# Patient Record
Sex: Male | Born: 1970 | Race: Black or African American | Hispanic: No | Marital: Married | Smoking: Never smoker
Health system: Southern US, Community
[De-identification: ages and names within clinical notes are randomized; demographics above are authoritative.]

## PROBLEM LIST (undated history)

## (undated) DIAGNOSIS — J42 Unspecified chronic bronchitis: Secondary | ICD-10-CM

## (undated) DIAGNOSIS — I10 Essential (primary) hypertension: Secondary | ICD-10-CM

## (undated) DIAGNOSIS — G459 Transient cerebral ischemic attack, unspecified: Secondary | ICD-10-CM

---

## 2007-03-02 ENCOUNTER — Ambulatory Visit (HOSPITAL_COMMUNITY): Admission: RE | Admit: 2007-03-02 | Discharge: 2007-03-02 | Payer: Self-pay | Admitting: Preventative Medicine

## 2007-03-17 ENCOUNTER — Encounter (HOSPITAL_COMMUNITY): Admission: RE | Admit: 2007-03-17 | Discharge: 2007-04-16 | Payer: Self-pay | Admitting: Preventative Medicine

## 2010-12-08 ENCOUNTER — Encounter: Payer: Self-pay | Admitting: Preventative Medicine

## 2015-01-08 ENCOUNTER — Emergency Department (HOSPITAL_COMMUNITY)
Admission: EM | Admit: 2015-01-08 | Discharge: 2015-01-08 | Disposition: A | Discharge status: Home / Self Care | Payer: 59 | Attending: Emergency Medicine | Admitting: Emergency Medicine

## 2015-01-08 ENCOUNTER — Emergency Department (HOSPITAL_COMMUNITY)
Admission: EM | Admit: 2015-01-08 | Discharge: 2015-01-08 | Disposition: A | Discharge status: Other Acute Inpatient Hospital | Payer: 59 | Source: Home / Self Care | Attending: Emergency Medicine | Admitting: Emergency Medicine

## 2015-01-08 ENCOUNTER — Encounter (HOSPITAL_COMMUNITY): Payer: Self-pay

## 2015-01-08 ENCOUNTER — Emergency Department (HOSPITAL_COMMUNITY): Payer: 59

## 2015-01-08 ENCOUNTER — Encounter (HOSPITAL_COMMUNITY): Payer: Self-pay | Admitting: Emergency Medicine

## 2015-01-08 DIAGNOSIS — Z8709 Personal history of other diseases of the respiratory system: Secondary | ICD-10-CM | POA: Insufficient documentation

## 2015-01-08 DIAGNOSIS — R079 Chest pain, unspecified: Secondary | ICD-10-CM

## 2015-01-08 DIAGNOSIS — I10 Essential (primary) hypertension: Secondary | ICD-10-CM | POA: Diagnosis not present

## 2015-01-08 DIAGNOSIS — R42 Dizziness and giddiness: Secondary | ICD-10-CM | POA: Insufficient documentation

## 2015-01-08 HISTORY — DX: Essential (primary) hypertension: I10

## 2015-01-08 HISTORY — DX: Unspecified chronic bronchitis: J42

## 2015-01-08 LAB — CBC
HCT: 40.3 % (ref 39.0–52.0)
Hemoglobin: 13.8 g/dL (ref 13.0–17.0)
MCH: 26.8 pg (ref 26.0–34.0)
MCHC: 34.2 g/dL (ref 30.0–36.0)
MCV: 78.3 fL (ref 78.0–100.0)
Platelets: 207 10*3/uL (ref 150–400)
RBC: 5.15 MIL/uL (ref 4.22–5.81)
RDW: 13.8 % (ref 11.5–15.5)
WBC: 4.6 10*3/uL (ref 4.0–10.5)

## 2015-01-08 LAB — BASIC METABOLIC PANEL
Anion gap: 9 (ref 5–15)
BUN: 10 mg/dL (ref 6–23)
CALCIUM: 8.7 mg/dL (ref 8.4–10.5)
CHLORIDE: 106 mmol/L (ref 96–112)
CO2: 24 mmol/L (ref 19–32)
CREATININE: 1.4 mg/dL — AB (ref 0.50–1.35)
GFR, EST AFRICAN AMERICAN: 70 mL/min — AB (ref >90)
GFR, EST NON AFRICAN AMERICAN: 61 mL/min — AB (ref >90)
GLUCOSE: 88 mg/dL (ref 70–99)
Potassium: 3.3 mmol/L — ABNORMAL LOW (ref 3.5–5.1)
SODIUM: 139 mmol/L (ref 135–145)

## 2015-01-08 LAB — I-STAT TROPONIN, ED
TROPONIN I, POC: 0.02 ng/mL (ref 0.00–0.08)
Troponin i, poc: 0.01 ng/mL (ref 0.00–0.08)

## 2015-01-08 MED ORDER — SODIUM CHLORIDE 0.9 % IV SOLN
INTRAVENOUS | Status: DC
  Administered 2015-01-08: 14:00:00 via INTRAVENOUS

## 2015-01-08 MED ORDER — ASPIRIN 81 MG PO CHEW
324.0000 mg | CHEWABLE_TABLET | Freq: Once | ORAL | Status: AC
  Administered 2015-01-08: 324 mg via ORAL

## 2015-01-08 MED ORDER — ASPIRIN 81 MG PO CHEW
CHEWABLE_TABLET | ORAL | Status: AC
  Filled 2015-01-08: qty 4

## 2015-01-08 NOTE — ED Notes (Signed)
Chest Xray at the bedside.  

## 2015-01-08 NOTE — ED Provider Notes (Signed)
CSN: [id number]     Arrival date & time 01/08/15  1442 History   First MD Initiated Contact with Patient 01/08/15 1457     Chief Complaint  Patient presents with  . Chest Pain     (Consider location/radiation/quality/duration/timing/severity/associated sxs/prior Treatment) HPI   PCP: No primary care provider on file. Blood pressure 170/95, pulse 60, temperature 98.1 F (36.7 C), temperature source Oral, resp. rate 19, SpO2 100 %.  Bryan Woods is a 44 y.o.male with a significant PMH of hypertension and chronic bronchitis presents to the ER with complaints of chest pain that hs been intermittent over the past week. The pain is substernal and does not radiate. He also endorses having arm weakness and numbness. He also has had some nausea, vomiting, diarrhea, and SOB. He has had symptoms of heart racing, feeling dizzy, lightheaded. He does not feel that rest or exertion changes his pain. He has not seen a cardiologist or had work up for this chest pain in the past. The symptoms usually take place between the night and morning. He reports having significant stressors in his life currently.  This morning he had pain from 5am-7:30am with some anxiety and mild SOB. He had no other associated symptoms. The symptoms resolved after he "settled down". He reports going to the  Urgent Care because his fiance made him and also agreeding to come to the ER because his fiance is making him. He currently has 0/0 pain and no associated symptoms.  Negative Review of Symptoms: fevers, cough, lower extremity swelling, confusion, hx of heart disease.  Past Medical History  Diagnosis Date  . Hypertension   . Chronic bronchitis    History reviewed. No pertinent past surgical history. No family history on file. History  Substance Use Topics  . Smoking status: Never Smoker   . Smokeless tobacco: Never Used  . Alcohol Use: No    Review of Systems  10 Systems reviewed and are negative for acute change  except as noted in the HPI.    Allergies  Review of patient's allergies indicates no known allergies.  Home Medications   Prior to Admission medications   Not on File   BP 158/104 mmHg  Pulse 73  Temp(Src) 98.1 F (36.7 C) (Oral)  Resp 21  SpO2 100% Physical Exam  Constitutional: He appears well-developed and well-nourished. No distress.  HENT:  Head: Normocephalic and atraumatic.  Eyes: Pupils are equal, round, and reactive to light.  Neck: Normal range of motion. Neck supple.  Cardiovascular: Normal rate and regular rhythm.   Pulmonary/Chest: Effort normal and breath sounds normal. He has no decreased breath sounds. He has no rhonchi. He exhibits no tenderness, no bony tenderness and no retraction.  Abdominal: Soft. He exhibits no distension. There is no tenderness. There is no rigidity, no rebound and no guarding.  Musculoskeletal:  No LE swelling.  Neurological: He is alert.  Skin: Skin is warm and dry.  Nursing note and vitals reviewed.   ED Course  Procedures (including critical care time) Labs Review Labs Reviewed  BASIC METABOLIC PANEL - Abnormal; Notable for the following:    Potassium 3.3 (*)    Creatinine, Ser 1.40 (*)    GFR calc non Af Amer 61 (*)    GFR calc Af Amer 70 (*)    All other components within normal limits  CBC  Rosezena SensorI-STAT TROPOININ, ED    Imaging Review Dg Chest Port 1 View  01/08/2015   CLINICAL DATA:  44 year old male with  left side chest pain for 2 days. Initial encounter.  EXAM: PORTABLE CHEST - 1 VIEW  COMPARISON:  None.  FINDINGS: Portable AP view at 1519 hours. Low lung volumes. Allowing for portable technique, the lungs are clear. No pneumothorax or effusion. Normal cardiac size and mediastinal contours. Visualized tracheal air column is within normal limits.  IMPRESSION: Low lung volumes, otherwise no acute cardiopulmonary abnormality.   Electronically Signed   By: Odessa Fleming M.D.   On: 01/08/2015 15:38     EKG Interpretation None       MDM   Final diagnoses:  Chest pain, unspecified chest pain type    Pt has a HEART score of 1  (hypertension) His presentation is atypical for ACS. He has had 1 negative Troponin and has been pain free since 7:30am. I discussed the case with DR. Effie Shy, we will delta Trop him and allow him to follow-up with his PCP where he lives in [place] since he is well established with his PCP.   4:30pm At end of shift, pt left with Tatyana K. PA-C. Pt waiting for delta Troponin which is to be drawn at 5:45 pm.  Filed Vitals:   01/08/15 1600  BP: 158/104  Pulse: 73  Temp:   Resp: [id number], PA-C 01/08/15 1632  Flint Melter, MD 01/08/15 [phone]

## 2015-01-08 NOTE — ED Provider Notes (Signed)
6:39 PM Patient signed out to me at shift change. Patient emergency department complaining of chest pain. Pain has been intermittent over last week. He is complaining of substernal chest pain, sometimes associated with shortness of breath and nausea. He also has had intermittent symptoms of heart racing and feeling dizzy. Also reports some anxiety associated with the symptoms. Patient is low risk for coronary disease. Patient's lab work including 2 troponins are negative. Chest x-ray negative. Plan will be to discharge home, with close outpatient follow-up. Patient is hypertensive, will need recheck. Patient's primary care doctor is out of town. They will call him tomorrow morning. He is instructed to return to emergency department for worsening symptoms. At this time patient is symptom-free, stable for discharge.  Results for orders placed or performed during the hospital encounter of 01/08/15  CBC  Result Value Ref Range   WBC 4.6 4.0 - 10.5 K/uL   RBC 5.15 4.22 - 5.81 MIL/uL   Hemoglobin 13.8 13.0 - 17.0 g/dL   HCT 16.140.3 09.639.0 - 04.552.0 %   MCV 78.3 78.0 - 100.0 fL   MCH 26.8 26.0 - 34.0 pg   MCHC 34.2 30.0 - 36.0 g/dL   RDW 40.913.8 81.111.5 - 91.415.5 %   Platelets 207 150 - 400 K/uL  Basic metabolic panel  Result Value Ref Range   Sodium 139 135 - 145 mmol/L   Potassium 3.3 (L) 3.5 - 5.1 mmol/L   Chloride 106 96 - 112 mmol/L   CO2 24 19 - 32 mmol/L   Glucose, Bld 88 70 - 99 mg/dL   BUN 10 6 - 23 mg/dL   Creatinine, Ser 7.821.40 (H) 0.50 - 1.35 mg/dL   Calcium 8.7 8.4 - 95.610.5 mg/dL   GFR calc non Af Amer 61 (L) >90 mL/min   GFR calc Af Amer 70 (L) >90 mL/min   Anion gap 9 5 - 15  [facility] troponin[facility] ED [facility])  Result Value Ref Range   Troponin i, poc 0.01 0.00 - 0.08 ng/mL   Comment 3          [facility]Stat Troponin[facility] ED [facility])  Result Value Ref Range   Troponin i, poc 0.02 0.00 - 0.08 ng/mL   Comment 3           Dg Chest Port 1 View  01/08/2015   CLINICAL DATA:  44 year old male with left side  chest pain for 2 days. Initial encounter.  EXAM: PORTABLE CHEST - 1 VIEW  COMPARISON:  None.  FINDINGS: Portable AP view at 1519 hours. Low lung volumes. Allowing for portable technique, the lungs are clear. No pneumothorax or effusion. Normal cardiac size and mediastinal contours. Visualized tracheal air column is within normal limits.  IMPRESSION: Low lung volumes, otherwise no acute cardiopulmonary abnormality.   Electronically Signed   By: Odessa FlemingH  Hall M.D.   On: 01/08/2015 15:38      Filed Vitals:   01/08/15 1700 01/08/15 1730 01/08/15 1745 01/08/15 1815  BP: 134/80 138/82 150/97 163/99  Pulse: 60 77 74 65  Temp:      TempSrc:      Resp: 16 17 22 24   SpO2: 99% 100% 100% 100%     Lottie Musselatyana A Lue [place], PA-C 01/08/15 1841  Arby BarretteMarcy Pfeiffer, MD 01/10/15 [phone]

## 2015-01-08 NOTE — ED Notes (Signed)
Tiffany, PA at the bedside. 

## 2015-01-08 NOTE — ED Notes (Signed)
[facility] here to transport 

## 2015-01-08 NOTE — ED Notes (Signed)
Phlebotomy at the bedside  

## 2015-01-08 NOTE — Discharge Instructions (Signed)
We have determined that your problem requires further evaluation in the emergency department.  We will take care of your transport there.  Once at the emergency department, you will be evaluated by a provider and they will order whatever treatment or tests they deem necessary.  We cannot guarantee that they will do any specific test or do any specific treatment.  ° °

## 2015-01-08 NOTE — ED Provider Notes (Signed)
Chief Complaint   Chest Pain   History of Present Illness    Bryan Woods is a 44 year old male has had a one-week history of intermittent substernal chest pressure without radiation rated 2-3/10 in intensity lasting for hours. Worse with stress and better if he relaxes. Unrelated to exertion. This has been associated with bilateral arm weakness and numbness in the right arm, nausea, vomiting, and shortness of breath. He's also felt his heart racing and felt dizzy and lightheaded. He has no history of heart disease, diabetes, elevated cholesterol, or cigarette smoking. He does have high blood pressure. He denies history of fever, chills, URI symptoms, coughing, wheezing, sputum production, or hemoptysis. Pain has not been pleuritic and he denies any leg pain or swelling. He's had no syncope. He has had some abdominal pain. He has no history of chronic kidney disease, lupus, cocaine use, or HIV.  Review of Systems    Other than noted above, the patient denies any of the following symptoms. Systemic:  No fever or chills. Pulmonary:  No cough, wheezing, shortness of breath, sputum production, hemoptysis. Cardiac:  No palpitations, rapid heartbeat, dizziness, presyncope or syncope. GI:  No abdominal pain, heartburn, nausea, or vomiting. Ext:  No leg pain or swelling.  PMFSH    Past medical history, family history, social history, meds, and allergies were reviewed.   Physical Exam     Vital signs:  BP 163/93 mmHg  Pulse 65  Temp(Src) 98.1 F (36.7 C) (Oral)  Resp 18  SpO2 99% Gen:  Alert, oriented, in no distress, skin warm and dry. ENT:  Mucous membranes moist, pharynx clear. Neck:  Supple, no adenopathy or tenderness.  No JVD. Lungs:  Clear to auscultation, no wheezes, rales or rhonchi.  No respiratory distress. Heart:  Regular rhythm.  No gallops, murmers, clicks or rubs. Chest:  No chest wall tenderness. Abdomen:  Soft, nontender, no organomegaly or mass.  Bowel sounds  normal.  No pulsatile abdominal mass or bruit. Ext:  No edema.  No calf tenderness and Homann's sign negative.  Pulses full and equal. Skin:  Warm and dry.  No rash.  EKG Results:  Date: 01/08/2015  Rate: 65  Rhythm: normal sinus rhythm  QRS Axis: normal--86  Intervals: normal--QTc interval 411 ms  ST/T Wave abnormalities: normal  Conduction Disutrbances:none  Narrative Interpretation: normal sinus rhythm, possible left atrial enlargement, borderline EKG  Old EKG Reviewed: none available    Course in Urgent Care Center   The following medications were given:  Medications  aspirin chewable tablet 324 mg   0.9 %  sodium chloride infusion                                                                                                                                      Assessment     The encounter diagnosis was Chest pain, unspecified chest pain type.  Differential diagnosis is acute coronary  syndrome, pulmonary embolism, ruptured aneurysm, pneumothorax, Boerhaave syndrome, pericarditis, musculoskeletal pain, or reflux esophagitis.   Plan     The patient was transferred to the ED via [facility] in stable condition.  Medical Decision Making:  44 year old male has had a one-week history of intermittent substernal chest pressure without radiation rated 2-3/10 in intensity lasting for hours. Worse with stress and better if he relaxes. Unrelated to exertion. This has been associated with bilateral arm weakness and numbness in the right arm, nausea, vomiting, and shortness of breath. He's also felt his heart racing and felt dizzy and lightheaded. He has no history of heart disease, diabetes, elevated cholesterol, or cigarette smoking. He does have high blood pressure. Examination was within normal limits his EKG was normal. We will start IV normal saline and give him aspirin. He's not having any pain right now, so no need for nitroglycerin. He needs to be ruled out for acute coronary syndrome.         Reuben Likesavid C Farron Watrous, MD 01/08/15 [phone]

## 2015-01-08 NOTE — ED Notes (Signed)
Per EMS, [facility] that started this morning at 0600. Patient reports dizziness, bilateral arm weakness, and an episode of nausea earlier, but denies any radiation or other symptoms. Patient has history of Hypertension. Given 324 mg of Asprin at Urgent Care. Patient's original pain was 5/10 and decreased to 1/10. Vitals per EMS: 164/90, 70 Regular HR, 16 RR, 99% on RA.

## 2015-01-08 NOTE — ED Notes (Signed)
Pt woke up this morning with chest pressure, headache, SOB, dizziness and some numbness and tingling in the right arm/elbow. Pt states he had a similar episode last week.

## 2015-01-08 NOTE — ED Notes (Signed)
PA at the bedside.

## 2015-01-08 NOTE — Discharge Instructions (Signed)
Take 81mg  aspirin daily. Follow up with your doctor as soon as able. Return if worsening symptoms.    Chest Pain (Nonspecific) It is often hard to give a specific diagnosis for the cause of chest pain. There is always a chance that your pain could be related to something serious, such as a heart attack or a blood clot in the lungs. You need to follow up with your health care provider for further evaluation. CAUSES   Heartburn.  Pneumonia or bronchitis.  Anxiety or stress.  Inflammation around your heart (pericarditis) or lung (pleuritis or pleurisy).  A blood clot in the lung.  A collapsed lung (pneumothorax). It can develop suddenly on its own (spontaneous pneumothorax) or from trauma to the chest.  Shingles infection (herpes zoster virus). The chest wall is composed of bones, muscles, and cartilage. Any of these can be the source of the pain.  The bones can be bruised by injury.  The muscles or cartilage can be strained by coughing or overwork.  The cartilage can be affected by inflammation and become sore (costochondritis). DIAGNOSIS  Lab tests or other studies may be needed to find the cause of your pain. Your health care provider may have you take a test called an ambulatory electrocardiogram (ECG). An ECG records your heartbeat patterns over a 24-hour period. You may also have other tests, such as:  Transthoracic echocardiogram (TTE). During echocardiography, sound waves are used to evaluate how blood flows through your heart.  Transesophageal echocardiogram (TEE).  Cardiac monitoring. This allows your health care provider to monitor your heart rate and rhythm in real time.  Holter monitor. This is a portable device that records your heartbeat and can help diagnose heart arrhythmias. It allows your health care provider to track your heart activity for several days, if needed.  Stress tests by exercise or by giving medicine that makes the heart beat faster. TREATMENT    Treatment depends on what may be causing your chest pain. Treatment may include:  Acid blockers for heartburn.  Anti-inflammatory medicine.  Pain medicine for inflammatory conditions.  Antibiotics if an infection is present.  You may be advised to change lifestyle habits. This includes stopping smoking and avoiding alcohol, caffeine, and chocolate.  You may be advised to keep your head raised (elevated) when sleeping. This reduces the chance of acid going backward from your stomach into your esophagus. Most of the time, nonspecific chest pain will improve within 2-3 days with rest and mild pain medicine.  HOME CARE INSTRUCTIONS   If antibiotics were prescribed, take them as directed. Finish them even if you start to feel better.  For the next few days, avoid physical activities that bring on chest pain. Continue physical activities as directed.  Do not use any tobacco products, including cigarettes, chewing tobacco, or electronic cigarettes.  Avoid drinking alcohol.  Only take medicine as directed by your health care provider.  Follow your health care provider's suggestions for further testing if your chest pain does not go away.  Keep any follow-up appointments you made. If you do not go to an appointment, you could develop lasting (chronic) problems with pain. If there is any problem keeping an appointment, call to reschedule. SEEK MEDICAL CARE IF:   Your chest pain does not go away, even after treatment.  You have a rash with blisters on your chest.  You have a fever. SEEK IMMEDIATE MEDICAL CARE IF:   You have increased chest pain or pain that spreads to your arm,  neck, jaw, back, or abdomen.  You have shortness of breath.  You have an increasing cough, or you cough up blood.  You have severe back or abdominal pain.  You feel nauseous or vomit.  You have severe weakness.  You faint.  You have chills. This is an emergency. Do not wait to see if the pain will  go away. Get medical help at once. Call your local emergency services (911 in [place] Do not drive yourself to the hospital. MAKE SURE YOU:   Understand these instructions.  Will watch your condition.  Will get help right away if you are not doing well or get worse. Document Released: 08/13/2005 Document Revised: 11/08/2013 Document Reviewed: 06/08/2008 [facility] Patient Information 2015 [place]. This information is not intended to replace advice given to you by your health care provider. Make sure you discuss any questions you have with your health care provider.

## 2019-03-15 ENCOUNTER — Other Ambulatory Visit: Payer: Self-pay

## 2019-03-15 ENCOUNTER — Ambulatory Visit (INDEPENDENT_AMBULATORY_CARE_PROVIDER_SITE_OTHER): Payer: Self-pay | Admitting: Family Medicine

## 2019-03-15 DIAGNOSIS — I10 Essential (primary) hypertension: Secondary | ICD-10-CM

## 2019-03-15 MED ORDER — LOSARTAN POTASSIUM 50 MG PO TABS: 50.0000 mg | ORAL_TABLET | Freq: Every day | ORAL | 3 refills | Status: DC

## 2019-03-15 NOTE — Progress Notes (Signed)
Patient ID: Bryan Woods, male   DOB: 12-Jun-1971, 48 y.o.   MRN: [id number]  This visit type was conducted due to national recommendations for restrictions regarding the COVID-19 pandemic in an effort to limit this patient's exposure and mitigate transmission in our community.   Virtual Visit via Video Note  I connected with Flossie Dibble on 03/15/19 at  1:15 PM EDT by a video enabled telemedicine application and verified that I am speaking with the correct person using two identifiers.  Location patient: home Location provider:work or home office Persons participating in the virtual visit: patient, provider  I discussed the limitations of evaluation and management by telemedicine and the availability of in person appointments. The patient expressed understanding and agreed to proceed.   HPI: Patient is establishing care.  He has history of hypertension.  He states about 10 years ago he had somewhat equivocal but possible stroke-versus complicated migraine.  He has been on blood pressure medication since then.  He was on lisinopril for several years but developed some cough.  Now takes losartan 50 mg daily.  He states his blood pressures been fairly well controlled.  Denies any recent headaches or dizziness.  No chest pains.  He currently lives in [place] but is looking at moving to [place] soon and he works in [place].  Never smoked.  No regular alcohol use.  Generally feels well at this time.  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  Past Medical History:  Diagnosis Date  . Chronic bronchitis   . Hypertension     No past surgical history on file.  No family history on file.  SOCIAL HX: Non-smoker.  No regular alcohol use   Current Outpatient Medications:  .  losartan (COZAAR) 50 MG tablet, Take 1 tablet (50 mg total) by mouth daily., Disp:  90 tablet, Rfl: 3  EXAM:  VITALS per patient if applicable:  GENERAL: alert, oriented, appears well and in no acute distress  HEENT: atraumatic, conjunttiva clear, no obvious abnormalities on inspection of external nose and ears  NECK: normal movements of the head and neck  LUNGS: on inspection no signs of respiratory distress, breathing rate appears normal, no obvious gross SOB, gasping or wheezing  CV: no obvious cyanosis  MS: moves all visible extremities without noticeable abnormality  PSYCH/NEURO: pleasant and cooperative, no obvious depression or anxiety, speech and thought processing grossly intact  ASSESSMENT AND PLAN:  Discussed the following assessment and plan:  Essential hypertension  -Refill losartan 50 mg once daily -Patient will call back later to establish complete physical and in office evaluation     I discussed the assessment and treatment plan with the patient. The patient was provided an opportunity to ask questions and all were answered. The patient agreed with the plan and demonstrated an understanding of the instructions.   The patient was advised to call back or seek an in-person evaluation if the symptoms worsen or if the condition fails to improve as anticipated.   Evelena Peat, MD

## 2019-03-29 ENCOUNTER — Encounter (HOSPITAL_COMMUNITY): Payer: Self-pay | Admitting: Obstetrics and Gynecology

## 2019-03-29 ENCOUNTER — Emergency Department (HOSPITAL_COMMUNITY): Payer: Medicaid - Out of State

## 2019-03-29 ENCOUNTER — Ambulatory Visit: Payer: Self-pay

## 2019-03-29 ENCOUNTER — Observation Stay (HOSPITAL_COMMUNITY)
Admission: EM | Admit: 2019-03-29 | Discharge: 2019-03-30 | Disposition: A | Discharge status: Home / Self Care | Payer: Medicaid - Out of State | Attending: Internal Medicine | Admitting: Internal Medicine

## 2019-03-29 ENCOUNTER — Other Ambulatory Visit: Payer: Self-pay

## 2019-03-29 DIAGNOSIS — Z1159 Encounter for screening for other viral diseases: Secondary | ICD-10-CM | POA: Insufficient documentation

## 2019-03-29 DIAGNOSIS — I10 Essential (primary) hypertension: Secondary | ICD-10-CM | POA: Diagnosis not present

## 2019-03-29 DIAGNOSIS — I160 Hypertensive urgency: Secondary | ICD-10-CM | POA: Diagnosis not present

## 2019-03-29 DIAGNOSIS — J181 Lobar pneumonia, unspecified organism: Secondary | ICD-10-CM | POA: Insufficient documentation

## 2019-03-29 DIAGNOSIS — Z79899 Other long term (current) drug therapy: Secondary | ICD-10-CM | POA: Insufficient documentation

## 2019-03-29 DIAGNOSIS — Z8673 Personal history of transient ischemic attack (TIA), and cerebral infarction without residual deficits: Secondary | ICD-10-CM | POA: Insufficient documentation

## 2019-03-29 DIAGNOSIS — R9431 Abnormal electrocardiogram [ECG] [EKG]: Secondary | ICD-10-CM | POA: Diagnosis not present

## 2019-03-29 DIAGNOSIS — Z7901 Long term (current) use of anticoagulants: Secondary | ICD-10-CM | POA: Insufficient documentation

## 2019-03-29 DIAGNOSIS — I351 Nonrheumatic aortic (valve) insufficiency: Secondary | ICD-10-CM | POA: Diagnosis not present

## 2019-03-29 DIAGNOSIS — J42 Unspecified chronic bronchitis: Secondary | ICD-10-CM | POA: Diagnosis not present

## 2019-03-29 DIAGNOSIS — I2699 Other pulmonary embolism without acute cor pulmonale: Principal | ICD-10-CM | POA: Insufficient documentation

## 2019-03-29 DIAGNOSIS — J189 Pneumonia, unspecified organism: Secondary | ICD-10-CM | POA: Diagnosis not present

## 2019-03-29 HISTORY — DX: Transient cerebral ischemic attack, unspecified: G45.9

## 2019-03-29 LAB — CBC
HCT: 42.5 % (ref 39.0–52.0)
Hemoglobin: 13.8 g/dL (ref 13.0–17.0)
MCH: 26.2 pg (ref 26.0–34.0)
MCHC: 32.5 g/dL (ref 30.0–36.0)
MCV: 80.6 fL (ref 80.0–100.0)
Platelets: 263 10*3/uL (ref 150–400)
RBC: 5.27 MIL/uL (ref 4.22–5.81)
RDW: 13.5 % (ref 11.5–15.5)
WBC: 6.8 10*3/uL (ref 4.0–10.5)
nRBC: 0 % (ref 0.0–0.2)

## 2019-03-29 LAB — COMPREHENSIVE METABOLIC PANEL
ALT: 33 U/L (ref 0–44)
AST: 24 U/L (ref 15–41)
Albumin: 4.5 g/dL (ref 3.5–5.0)
Alkaline Phosphatase: 66 U/L (ref 38–126)
Anion gap: 8 (ref 5–15)
BUN: 19 mg/dL (ref 6–20)
CO2: 24 mmol/L (ref 22–32)
Calcium: 9.4 mg/dL (ref 8.9–10.3)
Chloride: 109 mmol/L (ref 98–111)
Creatinine, Ser: 1.53 mg/dL — ABNORMAL HIGH (ref 0.61–1.24)
GFR calc Af Amer: >60 mL/min (ref >60)
GFR calc non Af Amer: 54 mL/min — ABNORMAL LOW (ref >60)
Glucose, Bld: 81 mg/dL (ref 70–99)
Potassium: 4 mmol/L (ref 3.5–5.1)
Sodium: 141 mmol/L (ref 135–145)
Total Bilirubin: 0.4 mg/dL (ref 0.3–1.2)
Total Protein: 7.6 g/dL (ref 6.5–8.1)

## 2019-03-29 LAB — TROPONIN I: Troponin I: <0.03 ng/mL (ref <0.03)

## 2019-03-29 LAB — D-DIMER, QUANTITATIVE: D-Dimer, Quant: 2.63 ug/mL-FEU — ABNORMAL HIGH (ref 0.00–0.50)

## 2019-03-29 LAB — BRAIN NATRIURETIC PEPTIDE: B Natriuretic Peptide: 22.7 pg/mL (ref 0.0–100.0)

## 2019-03-29 LAB — SARS CORONAVIRUS 2 BY RT PCR (HOSPITAL ORDER, PERFORMED IN ~~LOC~~ HOSPITAL LAB): SARS Coronavirus 2: NEGATIVE

## 2019-03-29 LAB — LIPASE, BLOOD: Lipase: 37 U/L (ref 11–51)

## 2019-03-29 LAB — HEPARIN LEVEL (UNFRACTIONATED): Heparin Unfractionated: 0.22 IU/mL — ABNORMAL LOW (ref 0.30–0.70)

## 2019-03-29 MED ORDER — LOSARTAN POTASSIUM 50 MG PO TABS
50.0000 mg | ORAL_TABLET | Freq: Once | ORAL | Status: AC
  Administered 2019-03-29: 50 mg via ORAL
  Filled 2019-03-29: qty 1

## 2019-03-29 MED ORDER — LOSARTAN POTASSIUM 50 MG PO TABS
50.0000 mg | ORAL_TABLET | Freq: Every day | ORAL | Status: DC
  Administered 2019-03-30: 50 mg via ORAL
  Filled 2019-03-29 (×2): qty 1

## 2019-03-29 MED ORDER — METOPROLOL TARTRATE 50 MG PO TABS
50.0000 mg | ORAL_TABLET | Freq: Two times a day (BID) | ORAL | Status: DC
  Administered 2019-03-30: 50 mg via ORAL
  Filled 2019-03-29 (×2): qty 1

## 2019-03-29 MED ORDER — HEPARIN BOLUS VIA INFUSION
3000.0000 [IU] | Freq: Once | INTRAVENOUS | Status: AC
  Administered 2019-03-29: 17:00:00 3000 [IU] via INTRAVENOUS
  Filled 2019-03-29: qty 3000

## 2019-03-29 MED ORDER — HYDRALAZINE HCL 20 MG/ML IJ SOLN
10.0000 mg | Freq: Four times a day (QID) | INTRAMUSCULAR | Status: DC | PRN
  Administered 2019-03-30: 10 mg via INTRAVENOUS
  Filled 2019-03-29: qty 1

## 2019-03-29 MED ORDER — SODIUM CHLORIDE (PF) 0.9 % IJ SOLN
INTRAMUSCULAR | Status: AC
  Filled 2019-03-29: qty 50

## 2019-03-29 MED ORDER — SODIUM CHLORIDE 0.9 % IV SOLN
1.0000 g | INTRAVENOUS | Status: DC
  Filled 2019-03-29: qty 10

## 2019-03-29 MED ORDER — IOHEXOL 350 MG/ML SOLN
100.0000 mL | Freq: Once | INTRAVENOUS | Status: AC | PRN
  Administered 2019-03-29: 100 mL via INTRAVENOUS

## 2019-03-29 MED ORDER — HEPARIN (PORCINE) 25000 UT/250ML-% IV SOLN
2000.0000 [IU]/h | INTRAVENOUS | Status: DC
  Administered 2019-03-29 – 2019-03-30 (×2): 2000 [IU]/h via INTRAVENOUS
  Filled 2019-03-29: qty 250

## 2019-03-29 MED ORDER — HEPARIN (PORCINE) 25000 UT/250ML-% IV SOLN
1700.0000 [IU]/h | INTRAVENOUS | Status: DC
  Administered 2019-03-29: 1700 [IU]/h via INTRAVENOUS
  Filled 2019-03-29: qty 250

## 2019-03-29 MED ORDER — HEPARIN BOLUS VIA INFUSION
2000.0000 [IU] | Freq: Once | INTRAVENOUS | Status: AC
  Administered 2019-03-29: 2000 [IU] via INTRAVENOUS
  Filled 2019-03-29: qty 2000

## 2019-03-29 MED ORDER — SODIUM CHLORIDE 0.9% FLUSH
3.0000 mL | Freq: Once | INTRAVENOUS | Status: AC
  Administered 2019-03-29: 3 mL via INTRAVENOUS

## 2019-03-29 NOTE — Progress Notes (Signed)
ANTICOAGULATION CONSULT NOTE - Initial Consult  Pharmacy Consult for IV heparin Indication: pulmonary embolus  No Known Allergies  Patient Measurements: Height: 5\' 11"  (180.3 cm) Weight: 240 lb (108.9 kg) IBW/kg (Calculated) : 75.3 Heparin Dosing Weight: 98.6  Vital Signs: Temp: 97.7 F (36.5 C) (05/12 1410) Temp Source: Oral (05/12 1410) BP: 180/113 (05/12 1429) Pulse Rate: 79 (05/12 1430)  Labs: Recent Labs    03/29/19 1417 03/29/19 1418  HGB  --  13.8  HCT  --  42.5  PLT  --  263  CREATININE 1.53*  --   TROPONINI  --  <0.03    Estimated Creatinine Clearance: 75.7 mL/min (A) (by C-G formula based on SCr of 1.53 mg/dL (H)).   Medical History: Past Medical History:  Diagnosis Date  . Chronic bronchitis (HCC)   . Hypertension   . TIA (transient ischemic attack)     Medications:  Scheduled:  . sodium chloride (PF)       Infusions:    Assessment: 48 yo male presented to ED with SOB and CP x 2 weeks found to have PE. To start IV heparin per pharmacy dosing. Baseline labs already drawn. Note not on any anticoagulant prior to admission  Goal of Therapy:  Heparin level 0.3-0.7 units/ml Monitor platelets by anticoagulation protocol: Yes   Plan:  1) 3000 unit IV heparin bolus then 2) 1700 units/hr IV heparin infusion rate 3) Check heparin level 6 hours after start of IV heparin 4) Daily CBC and heparin level  Hessie Knows, PharmD, BCPS 03/29/2019 4:25 PM

## 2019-03-29 NOTE — Telephone Encounter (Signed)
Please see message. °

## 2019-03-29 NOTE — Progress Notes (Signed)
ANTICOAGULATION CONSULT NOTE -  Consult  Pharmacy Consult for IV heparin Indication: pulmonary embolus  No Known Allergies  Patient Measurements: Height: 5\' 10"  (177.8 cm) Weight: 240 lb (108.9 kg) IBW/kg (Calculated) : 73 Heparin Dosing Weight: 98.6  Vital Signs: Temp: 97.8 F (36.6 C) (05/12 2110) Temp Source: Oral (05/12 2110) BP: 186/120 (05/12 2110) Pulse Rate: 76 (05/12 2110)  Labs: Recent Labs    03/29/19 1417 03/29/19 1418 03/29/19 2256  HGB  --  13.8  --   HCT  --  42.5  --   PLT  --  263  --   HEPARINUNFRC  --   --  0.22*  CREATININE 1.53*  --   --   TROPONINI  --  <0.03  --     Estimated Creatinine Clearance: 74.6 mL/min (A) (by C-G formula based on SCr of 1.53 mg/dL (H)).   Medical History: Past Medical History:  Diagnosis Date  . Chronic bronchitis (HCC)   . Hypertension   . TIA (transient ischemic attack)     Medications:  Scheduled:  . losartan  50 mg Oral Daily  . metoprolol tartrate  50 mg Oral BID  . sodium chloride (PF)       Infusions:  . cefTRIAXone (ROCEPHIN)  IV    . heparin 1,700 Units/hr (03/29/19 1640)    Assessment: 47 yo male presented to ED with SOB and CP x 2 weeks found to have PE. To start IV heparin per pharmacy dosing. Baseline labs already drawn. Note not on any anticoagulant prior to admission Today, 5/12 2318 HL = 0.22 below goal, no bleeding or infusion issues per RN.   Goal of Therapy:  Heparin level 0.3-0.7 units/ml Monitor platelets by anticoagulation protocol: Yes   Plan:  1) Give 2000 unit rebolus x1 now 2) Increase heparin drip to 2000 units/hr 3) Check heparin level 6 hours after rate increase 4) Daily CBC and heparin level  Lorenza Evangelist 03/29/2019 11:25 PM

## 2019-03-29 NOTE — Telephone Encounter (Signed)
Pt. Called to report upper mid abdominal pain for 2-2.5 weeks.  Stated the pain started gradually, and has worsened.  Has radiation of pain down into lower abdomen. C/o abdominal bloating, with freq. Burping and passing a lot of gas.  Rated pain at 8-9/10, and constant.  Stated has had normal stools daily, over past week, with exception of diarrhea x 1.  Reported he is short of breath with both rest and activity.  C/o intermittent nausea; no vomiting.  Denied fever/ chills.  Stated he had one episode of upper right, anterior chest pain within past week; unsure of how long it lasted.  Denied nausea or sweating with the chest pain.  Did report shortness of breath at that time.  Stated he rested and calmed himself, and the chest pain went away.  Advised due to worsening and severity of the abdominal pain, and of shortness of breath, he needs to be evaluated in the ER.  Care advice given per protocol.  Pt. Verb. Understanding.  Agreed with plan.   Reason for Disposition . [1] SEVERE pain (e.g., excruciating) AND [2] present > 1 hour  Answer Assessment - Initial Assessment Questions 1. LOCATION: "Where does it hurt?"      In upper mid abdomen, below the breast bone  2. RADIATION: "Does the pain shoot anywhere else?" (e.g., chest, back)     Some radiation downward into lower abdomen 3. ONSET: "When did the pain begin?" (Minutes, hours or days ago)      For a couple weeks 4. SUDDEN: "Gradual or sudden onset?"     Gradual onset  5. PATTERN "Does the pain come and go, or is it constant?"    - If constant: "Is it getting better, staying the same, or worsening?"      (Note: Constant means the pain never goes away completely; most serious pain is constant and it progresses)     - If intermittent: "How long does it last?" "Do you have pain now?"     (Note: Intermittent means the pain goes away completely between bouts) Constant pain  6. SEVERITY: "How bad is the pain?"  (e.g., Scale 1-10; mild, moderate, or  severe)    - MILD (1-3): doesn't interfere with normal activities, abdomen soft and not tender to touch     - MODERATE (4-7): interferes with normal activities or awakens from sleep, tender to touch     - SEVERE (8-10): excruciating pain, doubled over, unable to do any normal activities      8-9/10 7. RECURRENT SYMPTOM: "Have you ever had this type of abdominal pain before?" If so, ask: "When was the last time?" and "What happened that time?"      Several years ago; was placed on Aciphex at that time.  8. CAUSE: "What do you think is causing the abdominal pain?"     Unknown  9. RELIEVING/AGGRAVATING FACTORS: "What makes it better or worse?" (e.g., movement, antacids, bowel movement)     If upper abdomen gets bumped, it is painful; took Omeprazole for pain in upper abdomen; no relief 10. OTHER SYMPTOMS: "Has there been any vomiting, diarrhea, constipation, or urine problems?"      C/o abdominal bloating ; burping and passing a lot of gas ; reported having regular stools, alternating with constipation; has gone daily over past week; reported diarrhea x1 two days ago; c/o intermittent nausea ; no vomiting; no fever/chills; has shortness of breath with rest and activity over past 2-2.5 weeks.  Reported episode of  chest pain x 1, in last week on right anterior chest; reported he calmed himself, and it improved.  Protocols used: ABDOMINAL PAIN - MALE-A-AH

## 2019-03-29 NOTE — H&P (Signed)
History and Physical    Valeriy Talburt WTU:[id number] DOB: Apr 12, 1971 DOA: 03/29/2019  PCP: Kristian Covey, MD Patient coming from: Home  Chief Complaint: Shortness of breath HPI: Bryan Woods is a 48 y.o. male with medical history significant of hypertension came in with complaints of shortness of breath and chest pressure which increases with breathing for the past few hours.  He has no history of blood clots nor he has family history of blood clots.  He reports during quarantine he stayed home till they were called to work.  He denies any lower extremity swelling or pain he has chronic bilateral knee swelling and pain from playing basketball with his son.  He denies fever or chills he denies sick contacts or travel he denies cough COVID test was negative.  He denies genitourinary complaints he feels after he eats his belly bloats up and he has a lot of gas.  He has no constipation or diarrhea nausea or vomiting.  He denies headache changes with his vision localized weakness generalized weakness.  Denies smoking or alcohol use or illegal drug use  ED Course: Patient was started on heparin vital signs were 208/140, pulse is 84 pulse ox 93% on room air respirations 16 he is afebrile CT angiogram of the chest show several lower lobe pulmonary embolism bilaterally no evidence of right heart strain no thoracic aneurysm or dissection focal area of pneumonia in the right middle lobe superiorly and medially otherwise lungs are clear.  Plain chest x-ray official reading is lungs are clear.  Review of Systems: As per HPI otherwise all other systems reviewed and are negative  Ambulatory Status: Ambulatory at baseline  Past Medical History:  Diagnosis Date   Chronic bronchitis (HCC)    Hypertension    TIA (transient ischemic attack)     History reviewed. No pertinent surgical history.  Social History   Socioeconomic History   Marital status: Married    Spouse name: Not on file    Number of children: Not on file   Years of education: Not on file   Highest education level: Not on file  Occupational History   Not on file  Social Needs   Financial resource strain: Not on file   Food insecurity:    Worry: Not on file    Inability: Not on file   Transportation needs:    Medical: Not on file    Non-medical: Not on file  Tobacco Use   Smoking status: Never Smoker   Smokeless tobacco: Never Used  Substance and Sexual Activity   Alcohol use: No   Drug use: No   Sexual activity: Yes  Lifestyle   Physical activity:    Days per week: Not on file    Minutes per session: Not on file   Stress: Not on file  Relationships   Social connections:    Talks on phone: Not on file    Gets together: Not on file    Attends religious service: Not on file    Active member of club or organization: Not on file    Attends meetings of clubs or organizations: Not on file    Relationship status: Not on file   Intimate partner violence:    Fear of current or ex partner: Not on file    Emotionally abused: Not on file    Physically abused: Not on file    Forced sexual activity: Not on file  Other Topics Concern   Not on file  Social History  Narrative   Not on file    No Known Allergies  No family history on file.    Prior to Admission medications   Medication Sig Start Date End Date Taking? Authorizing Provider  losartan (COZAAR) 50 MG tablet Take 1 tablet (50 mg total) by mouth daily. 03/15/19   Burchette, Elberta Fortis, MD    Physical Exam: Patient resting in bed complaining of shortness of breath Vitals:   03/29/19 1410 03/29/19 1429 03/29/19 1430 03/29/19 1608  BP: (!) 190/120 (!) 180/113  (!) 208/140  Pulse: 99 79 79 84  Resp: 18 18 (!) 22 16  Temp: 97.7 F (36.5 C)     TempSrc: Oral     SpO2: 100% 95% 98% 93%  Weight:      Height:          General:  Appears calm and comfortable  Eyes:  PERRL, EOMI, normal lids, iris  ENT:  grossly normal  hearing, lips & tongue, mmm  Neck:  no LAD, masses or thyromegaly  Cardiovascular:  RRR, no m/r/g. No LE edema.   Respiratory: Scattered rhonchi in both lung fields bilaterally, no w/r/r. Normal respiratory effort.  Abdomen: soft, ntnd, NABS  Skin:  no rash or induration seen on limited exam  Musculoskeletal: grossly normal tone BUE/BLE, good ROM, no bony abnormality, trace bilateral lower extremity edema  Psychiatric: grossly normal mood and affect, speech fluent and appropriate, AOx3  Neurologic: CN 2-12 grossly intact, moves all extremities in coordinated fashion, sensation intact  Labs on Admission: I have personally reviewed following labs and imaging studies  CBC: Recent Labs  Lab 03/29/19 1418  WBC 6.8  HGB 13.8  HCT 42.5  MCV 80.6  PLT 263   Basic Metabolic Panel: Recent Labs  Lab 03/29/19 1417  NA 141  K 4.0  CL 109  CO2 24  GLUCOSE 81  BUN 19  CREATININE 1.53*  CALCIUM 9.4   GFR: Estimated Creatinine Clearance: 75.7 mL/min (A) (by C-G formula based on SCr of 1.53 mg/dL (H)). Liver Function Tests: Recent Labs  Lab 03/29/19 1417  AST 24  ALT 33  ALKPHOS 66  BILITOT 0.4  PROT 7.6  ALBUMIN 4.5   Recent Labs  Lab 03/29/19 1417  LIPASE 37   No results for input(s): AMMONIA in the last 168 hours. Coagulation Profile: No results for input(s): INR, PROTIME in the last 168 hours. Cardiac Enzymes: Recent Labs  Lab 03/29/19 1418  TROPONINI <0.03   BNP (last 3 results) No results for input(s): PROBNP in the last 8760 hours. HbA1C: No results for input(s): HGBA1C in the last 72 hours. CBG: No results for input(s): GLUCAP in the last 168 hours. Lipid Profile: No results for input(s): CHOL, HDL, LDLCALC, TRIG, CHOLHDL, LDLDIRECT in the last 72 hours. Thyroid Function Tests: No results for input(s): TSH, T4TOTAL, FREET4, T3FREE, THYROIDAB in the last 72 hours. Anemia Panel: No results for input(s): VITAMINB12, FOLATE, FERRITIN, TIBC, IRON,  RETICCTPCT in the last 72 hours. Urine analysis: No results found for: COLORURINE, APPEARANCEUR, LABSPEC, PHURINE, GLUCOSEU, HGBUR, BILIRUBINUR, KETONESUR, PROTEINUR, UROBILINOGEN, NITRITE, LEUKOCYTESUR  Creatinine Clearance: Estimated Creatinine Clearance: 75.7 mL/min (A) (by C-G formula based on SCr of 1.53 mg/dL (H)).  Sepsis Labs: (procalcitonin:4,lacticidven:4) ) Recent Results (from the past 240 hour(s))  SARS Coronavirus 2 (CEPHEID- Performed in [facility] hospital lab), Hosp Order     Status: None   Collection Time: 03/29/19  2:30 PM  Result Value Ref Range Status   SARS Coronavirus 2 NEGATIVE  NEGATIVE Final    Comment: (NOTE) If result is NEGATIVE SARS-CoV-2 target nucleic acids are NOT DETECTED. The SARS-CoV-2 RNA is generally detectable in upper and lower  respiratory specimens during the acute phase of infection. The lowest  concentration of SARS-CoV-2 viral copies this assay can detect is 250  copies / mL. A negative result does not preclude SARS-CoV-2 infection  and should not be used as the sole basis for treatment or other  patient management decisions.  A negative result may occur with  improper specimen collection / handling, submission of specimen other  than nasopharyngeal swab, presence of viral mutation(s) within the  areas targeted by this assay, and inadequate number of viral copies  (<250 copies / mL). A negative result must be combined with clinical  observations, patient history, and epidemiological information. If result is POSITIVE SARS-CoV-2 target nucleic acids are DETECTED. The SARS-CoV-2 RNA is generally detectable in upper and lower  respiratory specimens dur ing the acute phase of infection.  Positive  results are indicative of active infection with SARS-CoV-2.  Clinical  correlation with patient history and other diagnostic information is  necessary to determine patient infection status.  Positive results do  not rule out bacterial  infection or co-infection with other viruses. If result is PRESUMPTIVE POSTIVE SARS-CoV-2 nucleic acids MAY BE PRESENT.   A presumptive positive result was obtained on the submitted specimen  and confirmed on repeat testing.  While 2019 novel coronavirus  (SARS-CoV-2) nucleic acids may be present in the submitted sample  additional confirmatory testing may be necessary for epidemiological  and / or clinical management purposes  to differentiate between  SARS-CoV-2 and other Sarbecovirus currently known to infect humans.  If clinically indicated additional testing with an alternate test  methodology [phone]) is advised. The SARS-CoV-2 RNA is generally  detectable in upper and lower respiratory sp ecimens during the acute  phase of infection. The expected result is Negative. Fact Sheet for Patients:  BoilerBrush.com.cy Fact Sheet for Healthcare Providers: https://pope.com/ This test is not yet approved or cleared by the [place] FDA and has been authorized for detection and/or diagnosis of SARS-CoV-2 by FDA under an Emergency Use Authorization (EUA).  This EUA will remain in effect (meaning this test can be used) for the duration of the COVID-19 declaration under Section 564(b)(1) of the Act, 21 U.S.C. section 360bbb-3(b)(1), unless the authorization is terminated or revoked sooner. Performed at [facility], [address] [place], [place] [address]      Radiological Exams on Admission: Dg Chest 2 View  Result Date: 03/29/2019 CLINICAL DATA:  Chest pain shortness of breath EXAM: CHEST - 2 VIEW COMPARISON:  01/12/2015 FINDINGS: The heart size and mediastinal contours are within normal limits. Both lungs are clear. The visualized skeletal structures are unremarkable. IMPRESSION: No active cardiopulmonary disease. Electronically Signed   By: Katherine Mantle M.D.   On: 03/29/2019 14:31   Ct Angio Chest Pe W  And/or Wo Contrast  Result Date: 03/29/2019 CLINICAL DATA:  Shortness of breath and chest pain EXAM: CT ANGIOGRAPHY CHEST WITH CONTRAST TECHNIQUE: Multidetector CT imaging of the chest was performed using the standard protocol during bolus administration of intravenous contrast. Multiplanar CT image reconstructions and MIPs were obtained to evaluate the vascular anatomy. CONTRAST:  OMNIPAQUE IOHEXOL 350 MG/ML SOLN COMPARISON:  Chest radiograph Mar 29, 2019 FINDINGS: Cardiovascular: There are foci of lower lobe pulmonary emboli bilaterally. No more central pulmonary emboli are evident. There is no demonstrable right heart  strain. There is no thoracic aortic aneurysm or dissection. Visualized great vessels appear unremarkable. There is no pericardial effusion or pericardial thickening. There is mild left ventricular hypertrophy. Mediastinum/Nodes: Thyroid appears normal. No adenopathy is evident in the thoracic region. No esophageal lesions are appreciable. Lungs/Pleura: There is a small area of airspace opacity in the superior aspect of the medial segment right middle lobe consistent with a focus of pneumonia. The lungs elsewhere are clear. No pleural effusion or pleural thickening evident. No demonstrable pulmonary infarct. Upper Abdomen: There is incomplete visualization of a cyst arising in the upper right kidney measuring 1.7 x 1.7 cm. Visualized upper abdominal structures otherwise appear unremarkable. Musculoskeletal: There are no blastic or lytic bone lesions. There are no evident chest wall lesions. Review of the MIP images confirms the above findings. IMPRESSION: 1. Several lower lobe pulmonary emboli bilaterally. No more central pulmonary embolus. No evident right heart strain. 2.  No thoracic aortic aneurysm or dissection. 3. Focal area of pneumonia in the right middle lobe superiorly and medially. Lungs elsewhere clear. 4.  No appreciable thoracic adenopathy. 5.  Mild left ventricular hypertrophy.  Critical Value/emergent results were called by telephone at the time of interpretation on 03/29/2019 at 4:12 pm to Dr. Loren RacerAVID YELVERTON , who verbally acknowledged these results. Electronically Signed   By: Bretta BangWilliam  Woodruff III M.D.   On: 03/29/2019 16:13    EKG: Sinus tach J-point elevation in V3  Assessment/Plan Active Problems:   * No active hospital problems. *   #1 bilateral pulmonary embolism-patient presented with chest pressure and shortness of breath.  I will order lower extremity Doppler echocardiogram.  Patient was started on heparin continue heparin consult case management for pricing of oral anticoagulation.  He reports he is not a type of sits around he is always on the go on his feet.  #2 hypertensive urgency blood pressure 208/140 while I saw him I will continue his home medications and start Lopressor 50 twice daily and hydralazine IV as needed.  #3?  Pneumonia by CT of the chest though he is afebrile and COVID negative because asymptomatic with chest pressure pleurisy and shortness of breath I will treat him with Rocephin.  He very well could have 2 processes going on PE and pneumonia.  Estimated body mass index is 33.47 kg/m as calculated from the following:   Height as of this encounter: 5\' 11"  (1.803 m).   Weight as of this encounter: 108.9 kg.   DVT prophylaxis: Heparin Code Status: Full code Family Communication: None Disposition Plan pending clinical improvement Consults called: None Admission status: Observation   Alwyn RenElizabeth G Quetzali Heinle MD [facility]  If 7PM-7AM, please contact night-coverage [email or web address] Password [email or web address]  03/29/2019, 5:05 PM

## 2019-03-29 NOTE — ED Provider Notes (Signed)
[facility]-EMERGENCY DEPT Provider Note   CSN: [id number] Arrival date & time: 03/29/19  1358    History   Chief Complaint Chief Complaint  Patient presents with  . Chest Pain  . Shortness of Breath    HPI Bryan Woods is a 48 y.o. male.     HPI Patient presents with 2 weeks of increased shortness of breath especially with minimal exertion.  Also complains of chest tightness.  Denies cough, fever or chills.  No known coronavirus exposure.  Denies new lower extremity swelling or pain.  Patient states symptoms started roughly around the time his lisinopril was changed to losartan.  States he did not take his losartan this morning. Past Medical History:  Diagnosis Date  . Chronic bronchitis (HCC)   . Hypertension   . TIA (transient ischemic attack)     There are no active problems to display for this patient.   History reviewed. No pertinent surgical history.      Home Medications    Prior to Admission medications   Medication Sig Start Date End Date Taking? Authorizing Provider  losartan (COZAAR) 50 MG tablet Take 1 tablet (50 mg total) by mouth daily. 03/15/19   Burchette, Elberta FortisBruce W, MD    Family History No family history on file.  Social History Social History   Tobacco Use  . Smoking status: Never Smoker  . Smokeless tobacco: Never Used  Substance Use Topics  . Alcohol use: No  . Drug use: No     Allergies   Patient has no known allergies.   Review of Systems Review of Systems  Constitutional: Negative for chills and fever.  HENT: Negative for sore throat.   Eyes: Negative for visual disturbance.  Respiratory: Positive for chest tightness and shortness of breath. Negative for cough.   Cardiovascular: Negative for chest pain, palpitations and leg swelling.  Gastrointestinal: Positive for abdominal pain. Negative for constipation, diarrhea, nausea and vomiting.  Genitourinary: Negative for dysuria, flank pain and frequency.   Musculoskeletal: Negative for back pain, myalgias and neck pain.  Skin: Negative for rash and wound.  Neurological: Negative for dizziness, weakness, light-headedness, numbness and headaches.  All other systems reviewed and are negative.    Physical Exam Updated Vital Signs BP (!) 180/113   Pulse 79   Temp 97.7 F (36.5 C) (Oral)   Resp (!) 22   Ht 5\' 11"  (1.803 m)   Wt 108.9 kg   SpO2 98%   BMI 33.47 kg/m   Physical Exam Vitals signs and nursing note reviewed.  Constitutional:      General: He is not in acute distress.    Appearance: Normal appearance. He is well-developed. He is not ill-appearing.  HENT:     Head: Normocephalic and atraumatic.     Nose: Nose normal.     Mouth/Throat:     Mouth: Mucous membranes are moist.  Eyes:     Extraocular Movements: Extraocular movements intact.     Pupils: Pupils are equal, round, and reactive to light.  Neck:     Musculoskeletal: Normal range of motion and neck supple. No neck rigidity or muscular tenderness.  Cardiovascular:     Rate and Rhythm: Normal rate and regular rhythm.     Heart sounds: No murmur. No friction rub. No gallop.   Pulmonary:     Effort: Pulmonary effort is normal. No respiratory distress.     Breath sounds: Normal breath sounds. No stridor. No wheezing, rhonchi or rales.  Chest:  Chest wall: No tenderness.  Abdominal:     General: Bowel sounds are normal.     Palpations: Abdomen is soft.     Tenderness: There is abdominal tenderness. There is no right CVA tenderness, left CVA tenderness, guarding or rebound.     Comments: Epigastric discomfort with palpation.  Musculoskeletal: Normal range of motion.        General: No swelling, tenderness, deformity or signs of injury.     Right lower leg: No edema.     Left lower leg: No edema.     Comments: No midline thoracic or lumbar tenderness.  No lower extremity swelling, asymmetry or tenderness.  Lymphadenopathy:     Cervical: No cervical adenopathy.   Skin:    General: Skin is warm and dry.     Findings: No erythema or rash.  Neurological:     General: No focal deficit present.     Mental Status: He is alert and oriented to person, place, and time.  Psychiatric:        Mood and Affect: Mood normal.        Behavior: Behavior normal.      ED Treatments / Results  Labs (all labs ordered are listed, but only abnormal results are displayed) Labs Reviewed  COMPREHENSIVE METABOLIC PANEL - Abnormal; Notable for the following components:      Result Value   Creatinine, Ser 1.53 (*)    GFR calc non Af Amer 54 (*)    All other components within normal limits  D-DIMER, QUANTITATIVE (NOT AT [facility]) - Abnormal; Notable for the following components:   D-Dimer, Quant 2.63 (*)    All other components within normal limits  SARS CORONAVIRUS 2 (HOSPITAL ORDER, PERFORMED IN [facility])  CBC  TROPONIN I  LIPASE, BLOOD  BRAIN NATRIURETIC PEPTIDE  HEPARIN LEVEL (UNFRACTIONATED)    EKG EKG Interpretation  Date/Time:  Tuesday Mar 29 2019 14:11:26 EDT Ventricular Rate:  102 PR Interval:    QRS Duration: 77 QT Interval:  324 QTC Calculation: 422 R Axis:   95 Text Interpretation:  Sinus tachycardia Borderline right axis deviation ST elev, probable normal early repol pattern Baseline wander in lead(s) V5 V6 Confirmed by Loren Racer ([id number]) on 03/29/2019 2:45:13 PM   Radiology Dg Chest 2 View  Result Date: 03/29/2019 CLINICAL DATA:  Chest pain shortness of breath EXAM: CHEST - 2 VIEW COMPARISON:  01/12/2015 FINDINGS: The heart size and mediastinal contours are within normal limits. Both lungs are clear. The visualized skeletal structures are unremarkable. IMPRESSION: No active cardiopulmonary disease. Electronically Signed   By: Katherine Mantle M.D.   On: 03/29/2019 14:31   Ct Angio Chest Pe W And/or Wo Contrast  Result Date: 03/29/2019 CLINICAL DATA:  Shortness of breath and chest pain EXAM: CT ANGIOGRAPHY CHEST WITH  CONTRAST TECHNIQUE: Multidetector CT imaging of the chest was performed using the standard protocol during bolus administration of intravenous contrast. Multiplanar CT image reconstructions and MIPs were obtained to evaluate the vascular anatomy. CONTRAST:  OMNIPAQUE IOHEXOL 350 MG/ML SOLN COMPARISON:  Chest radiograph Mar 29, 2019 FINDINGS: Cardiovascular: There are foci of lower lobe pulmonary emboli bilaterally. No more central pulmonary emboli are evident. There is no demonstrable right heart strain. There is no thoracic aortic aneurysm or dissection. Visualized great vessels appear unremarkable. There is no pericardial effusion or pericardial thickening. There is mild left ventricular hypertrophy. Mediastinum/Nodes: Thyroid appears normal. No adenopathy is evident in the thoracic region. No esophageal lesions are appreciable.  Lungs/Pleura: There is a small area of airspace opacity in the superior aspect of the medial segment right middle lobe consistent with a focus of pneumonia. The lungs elsewhere are clear. No pleural effusion or pleural thickening evident. No demonstrable pulmonary infarct. Upper Abdomen: There is incomplete visualization of a cyst arising in the upper right kidney measuring 1.7 x 1.7 cm. Visualized upper abdominal structures otherwise appear unremarkable. Musculoskeletal: There are no blastic or lytic bone lesions. There are no evident chest wall lesions. Review of the MIP images confirms the above findings. IMPRESSION: 1. Several lower lobe pulmonary emboli bilaterally. No more central pulmonary embolus. No evident right heart strain. 2.  No thoracic aortic aneurysm or dissection. 3. Focal area of pneumonia in the right middle lobe superiorly and medially. Lungs elsewhere clear. 4.  No appreciable thoracic adenopathy. 5.  Mild left ventricular hypertrophy. Critical Value/emergent results were called by telephone at the time of interpretation on 03/29/2019 at 4:12 pm to Dr. Loren Racer , who verbally acknowledged these results. Electronically Signed   By: Bretta Bang III M.D.   On: 03/29/2019 16:13    Procedures Procedures (including critical care time)  Medications Ordered in ED Medications  sodium chloride (PF) 0.9 % injection (has no administration in time range)  losartan (COZAAR) tablet 50 mg (has no administration in time range)  heparin bolus via infusion 3,000 Units (has no administration in time range)  heparin ADULT infusion 100 units/mL (25000 units/260mL sodium chloride 0.45%) (has no administration in time range)  sodium chloride flush (NS) 0.9 % injection 3 mL (3 mLs Intravenous Given 03/29/19 1430)  iohexol (OMNIPAQUE) 350 MG/ML injection 100 mL (100 mLs Intravenous Contrast Given 03/29/19 1558)     Initial Impression / Assessment and Plan / ED Course  I have reviewed the triage vital signs and the nursing notes.  Pertinent labs & imaging results that were available during my care of the patient were reviewed by me and considered in my medical decision making (see chart for details).        CT angios with evidence of bilateral PEs.  No right heart strain.  Will start on anticoagulation.  Also given oral antihypertensive.  Discussed with hospitalist will see patient in the emergency department and admit.  Final Clinical Impressions(s) / ED Diagnoses   Final diagnoses:  Bilateral pulmonary embolism [facility])    ED Discharge Orders    None       Loren Racer, MD 03/29/19 [phone]

## 2019-03-29 NOTE — Telephone Encounter (Signed)
Called patient and he stated that he is on his way to [facility] now. Sounded like he was in a vehicle. I advised for patient to set up a follow up after his ER visit to see how we can further assist him. Patient verbalized an understanding.

## 2019-03-29 NOTE — ED Notes (Signed)
ED TO INPATIENT HANDOFF REPORT  ED Nurse Name and Phone #: [phone]  S Name/Age/Gender Bryan Woods 48 y.o. male Room/Bed: RESA/RESA  Code Status   Code Status: Full Code  Home/SNF/Other Home Patient oriented to: self, place, time and situation Is this baseline? Yes   Triage Complete: Triage complete  Chief Complaint Chest Pain; Shortness of Breath   Triage Note Pt reports SOB and chest pain x2 weeks. Pt reports mid-chest pain. Pt reports the only relief he gets is when he belches. Pt reports nothing seems to exasperate the SOB. Pt denies N/V/D   Allergies No Known Allergies  Level of Care/Admitting Diagnosis ED Disposition    ED Disposition Condition Comment   Admit  [facility] [[id number]]  Level of Care: Telemetry [5]  Admit to tele based on following criteria: Other see comments  Comments: pe  Covid Evaluation: N/A  Diagnosis: Pulmonary air embolism, initial encounter [facility](HCC) [[id number]]  Admitting Physician: Alwyn RenMATHEWS, ELIZABETH G [[id number]]  Attending Physician: Alwyn RenMATHEWS, ELIZABETH G [phone][[id number]]  PT Class (Do Not Modify): Observation [104]  PT Acc Code (Do Not Modify): Observation [10022]       B Medical/Surgery History Past Medical History:  Diagnosis Date  . Chronic bronchitis (HCC)   . Hypertension   . TIA (transient ischemic attack)    History reviewed. No pertinent surgical history.   A IV Location/Drains/Wounds Patient Lines/Drains/Airways Status   Active Line/Drains/Airways    Name:   Placement date:   Placement time:   Site:   Days:   Peripheral IV 03/29/19 Right Antecubital   03/29/19    1422    Antecubital   less than 1          Intake/Output Last 24 hours No intake or output data in the 24 hours ending 03/29/19 1753  Labs/Imaging Results for orders placed or performed during the hospital encounter of 03/29/19 (from the past 48 hour(s))  Comprehensive metabolic panel     Status: Abnormal   Collection  Time: 03/29/19  2:17 PM  Result Value Ref Range   Sodium 141 135 - 145 mmol/L   Potassium 4.0 3.5 - 5.1 mmol/L   Chloride 109 98 - 111 mmol/L   CO2 24 22 - 32 mmol/L   Glucose, Bld 81 70 - 99 mg/dL   BUN 19 6 - 20 mg/dL   Creatinine, Ser 2.951.53 (H) 0.61 - 1.24 mg/dL   Calcium 9.4 8.9 - 28.410.3 mg/dL   Total Protein 7.6 6.5 - 8.1 g/dL   Albumin 4.5 3.5 - 5.0 g/dL   AST 24 15 - 41 U/L   ALT 33 0 - 44 U/L   Alkaline Phosphatase 66 38 - 126 U/L   Total Bilirubin 0.4 0.3 - 1.2 mg/dL   GFR calc non Af Amer 54 (L) >60 mL/min   GFR calc Af Amer >60 >60 mL/min   Anion gap 8 5 - 15    Comment: Performed at [facility], [address] [place], [place] [address]  Lipase, blood     Status: None   Collection Time: 03/29/19  2:17 PM  Result Value Ref Range   Lipase 37 11 - 51 U/L    Comment: Performed at [facility], [address] [place], [place] [address]  D-dimer, quantitative (not at [facility])     Status: Abnormal   Collection Time: 03/29/19  2:17 PM  Result Value Ref Range   D-Dimer, Quant 2.63 (H) 0.00 - 0.50 ug/mL-FEU  Comment: (NOTE) At the manufacturer cut-off of 0.50 ug/mL FEU, this assay has been documented to exclude PE with a sensitivity and negative predictive value of 97 to 99%.  At this time, this assay has not been approved by the FDA to exclude DVT/VTE. Results should be correlated with clinical presentation. Performed at [facility], [address], [place], [place] [address]   CBC     Status: None   Collection Time: 03/29/19  2:18 PM  Result Value Ref Range   WBC 6.8 4.0 - 10.5 K/uL   RBC 5.27 4.22 - 5.81 MIL/uL   Hemoglobin 13.8 13.0 - 17.0 g/dL   HCT 60.4 54.0 - 98.1 %   MCV 80.6 80.0 - 100.0 fL   MCH 26.2 26.0 - 34.0 pg   MCHC 32.5 30.0 - 36.0 g/dL   RDW 19.1 47.8 - 29.5 %   Platelets 263 150 - 400 K/uL   nRBC 0.0 0.0 - 0.2 %    Comment: Performed at [facility], [address] [place], [place] [address]  Troponin I - ONCE - STAT     Status: None   Collection Time: 03/29/19  2:18 PM  Result Value Ref Range   Troponin I <0.03 <0.03 ng/mL    Comment: Performed at [facility], [address], [place], [place] [address]  Brain natriuretic peptide     Status: None   Collection Time: 03/29/19  2:18 PM  Result Value Ref Range   B Natriuretic Peptide 22.7 0.0 - 100.0 pg/mL    Comment: Performed at [facility], [address], [place], [place] [address]  SARS Coronavirus 2 (CEPHEID- Performed in [facility] hospital lab), Hosp Order     Status: None   Collection Time: 03/29/19  2:30 PM  Result Value Ref Range   SARS Coronavirus 2 NEGATIVE NEGATIVE    Comment: (NOTE) If result is NEGATIVE SARS-CoV-2 target nucleic acids are NOT DETECTED. The SARS-CoV-2 RNA is generally detectable in upper and lower  respiratory specimens during the acute phase of infection. The lowest  concentration of SARS-CoV-2 viral copies this assay can detect is 250  copies / mL. A negative result does not preclude SARS-CoV-2 infection  and should not be used as the sole basis for treatment or other  patient management decisions.  A negative result may occur with  improper specimen collection / handling, submission of specimen other  than nasopharyngeal swab, presence of viral mutation(s) within the  areas targeted by this assay, and inadequate number of viral copies  (<250 copies / mL). A negative result must be combined with clinical  observations, patient history, and epidemiological information. If result is POSITIVE SARS-CoV-2 target nucleic acids are DETECTED. The SARS-CoV-2 RNA is generally detectable in upper and lower  respiratory specimens dur ing the acute phase of infection.  Positive  results are indicative of active infection with SARS-CoV-2.  Clinical  correlation with patient history and other diagnostic information is  necessary to  determine patient infection status.  Positive results do  not rule out bacterial infection or co-infection with other viruses. If result is PRESUMPTIVE POSTIVE SARS-CoV-2 nucleic acids MAY BE PRESENT.   A presumptive positive result was obtained on the submitted specimen  and confirmed on repeat testing.  While 2019 novel coronavirus  (SARS-CoV-2) nucleic acids may be present in the submitted sample  additional confirmatory testing may be necessary for epidemiological  and / or clinical management purposes  to differentiate between  SARS-CoV-2 and other Sarbecovirus currently known to infect humans.  If clinically indicated additional testing with an alternate test  methodology [phone]) is advised. The SARS-CoV-2 RNA is generally  detectable in upper and lower respiratory sp ecimens during the acute  phase of infection. The expected result is Negative. Fact Sheet for Patients:  BoilerBrush.com.cy Fact Sheet for Healthcare Providers: https://pope.com/ This test is not yet approved or cleared by the [place] FDA and has been authorized for detection and/or diagnosis of SARS-CoV-2 by FDA under an Emergency Use Authorization (EUA).  This EUA will remain in effect (meaning this test can be used) for the duration of the COVID-19 declaration under Section 564(b)(1) of the Act, 21 U.S.C. section 360bbb-3(b)(1), unless the authorization is terminated or revoked sooner. Performed at [facility], [address] [place], [place] [address]    Dg Chest 2 View  Result Date: 03/29/2019 CLINICAL DATA:  Chest pain shortness of breath EXAM: CHEST - 2 VIEW COMPARISON:  01/12/2015 FINDINGS: The heart size and mediastinal contours are within normal limits. Both lungs are clear. The visualized skeletal structures are unremarkable. IMPRESSION: No active cardiopulmonary disease. Electronically Signed   By: Katherine Mantle M.D.   On:  03/29/2019 14:31   Ct Angio Chest Pe W And/or Wo Contrast  Result Date: 03/29/2019 CLINICAL DATA:  Shortness of breath and chest pain EXAM: CT ANGIOGRAPHY CHEST WITH CONTRAST TECHNIQUE: Multidetector CT imaging of the chest was performed using the standard protocol during bolus administration of intravenous contrast. Multiplanar CT image reconstructions and MIPs were obtained to evaluate the vascular anatomy. CONTRAST:  OMNIPAQUE IOHEXOL 350 MG/ML SOLN COMPARISON:  Chest radiograph Mar 29, 2019 FINDINGS: Cardiovascular: There are foci of lower lobe pulmonary emboli bilaterally. No more central pulmonary emboli are evident. There is no demonstrable right heart strain. There is no thoracic aortic aneurysm or dissection. Visualized great vessels appear unremarkable. There is no pericardial effusion or pericardial thickening. There is mild left ventricular hypertrophy. Mediastinum/Nodes: Thyroid appears normal. No adenopathy is evident in the thoracic region. No esophageal lesions are appreciable. Lungs/Pleura: There is a small area of airspace opacity in the superior aspect of the medial segment right middle lobe consistent with a focus of pneumonia. The lungs elsewhere are clear. No pleural effusion or pleural thickening evident. No demonstrable pulmonary infarct. Upper Abdomen: There is incomplete visualization of a cyst arising in the upper right kidney measuring 1.7 x 1.7 cm. Visualized upper abdominal structures otherwise appear unremarkable. Musculoskeletal: There are no blastic or lytic bone lesions. There are no evident chest wall lesions. Review of the MIP images confirms the above findings. IMPRESSION: 1. Several lower lobe pulmonary emboli bilaterally. No more central pulmonary embolus. No evident right heart strain. 2.  No thoracic aortic aneurysm or dissection. 3. Focal area of pneumonia in the right middle lobe superiorly and medially. Lungs elsewhere clear. 4.  No appreciable thoracic  adenopathy. 5.  Mild left ventricular hypertrophy. Critical Value/emergent results were called by telephone at the time of interpretation on 03/29/2019 at 4:12 pm to Dr. Loren Racer , who verbally acknowledged these results. Electronically Signed   By: Bretta Bang III M.D.   On: 03/29/2019 16:13    Pending Labs Unresulted Labs (From admission, onward)    Start     Ordered   03/30/19 0500  CBC  Daily,   R     03/29/19 1626   03/30/19 0500  Comprehensive metabolic panel  Tomorrow morning,  R     03/29/19 1708   03/29/19 2300  Heparin level (unfractionated)  Once-Timed,   R     03/29/19 1626   03/29/19 1707  HIV antibody (Routine Testing)  Once,   R     03/29/19 1708          Vitals/Pain Today's Vitals   03/29/19 1410 03/29/19 1429 03/29/19 1430 03/29/19 1608  BP: (!) 190/120 (!) 180/113  (!) 208/140  Pulse: 99 79 79 84  Resp: 18 18 (!) 22 16  Temp: 97.7 F (36.5 C)     TempSrc: Oral     SpO2: 100% 95% 98% 93%  Weight:      Height:      PainSc:        Isolation Precautions Droplet and Contact precautions  Medications Medications  sodium chloride (PF) 0.9 % injection (has no administration in time range)  heparin ADULT infusion 100 units/mL (25000 units/25mL sodium chloride 0.45%) (1,700 Units/hr Intravenous New Bag/Given 03/29/19 1640)  losartan (COZAAR) tablet 50 mg (has no administration in time range)  metoprolol tartrate (LOPRESSOR) tablet 50 mg (has no administration in time range)  hydrALAZINE (APRESOLINE) injection 10 mg (has no administration in time range)  cefTRIAXone (ROCEPHIN) 1 g in sodium chloride 0.9 % 100 mL IVPB (has no administration in time range)  sodium chloride flush (NS) 0.9 % injection 3 mL (3 mLs Intravenous Given 03/29/19 1430)  iohexol (OMNIPAQUE) 350 MG/ML injection 100 mL (100 mLs Intravenous Contrast Given 03/29/19 1558)  losartan (COZAAR) tablet 50 mg (50 mg Oral Given 03/29/19 1642)  heparin bolus via infusion 3,000 Units (3,000  Units Intravenous Bolus from Bag 03/29/19 1640)    Mobility walks Low fall risk   Focused Assessments Cardiac Assessment Handoff:    Lab Results  Component Value Date   TROPONINI <0.03 03/29/2019   Lab Results  Component Value Date   DDIMER 2.63 (H) 03/29/2019   Does the Patient currently have chest pain? No     R Recommendations: See Admitting Provider Note  Report given to:   Additional Notes: PE

## 2019-03-29 NOTE — Telephone Encounter (Signed)
Agree with advice for ER evaluation 

## 2019-03-29 NOTE — ED Triage Notes (Signed)
Pt reports SOB and chest pain x2 weeks. Pt reports mid-chest pain. Pt reports the only relief he gets is when he belches. Pt reports nothing seems to exasperate the SOB. Pt denies N/V/D

## 2019-03-30 ENCOUNTER — Observation Stay (HOSPITAL_BASED_OUTPATIENT_CLINIC_OR_DEPARTMENT_OTHER): Payer: Medicaid - Out of State

## 2019-03-30 ENCOUNTER — Encounter (HOSPITAL_COMMUNITY): Payer: Self-pay | Admitting: *Deleted

## 2019-03-30 DIAGNOSIS — R52 Pain, unspecified: Secondary | ICD-10-CM

## 2019-03-30 DIAGNOSIS — R9431 Abnormal electrocardiogram [ECG] [EKG]: Secondary | ICD-10-CM

## 2019-03-30 LAB — CBC
HCT: 40.3 % (ref 39.0–52.0)
Hemoglobin: 13.2 g/dL (ref 13.0–17.0)
MCH: 26.2 pg (ref 26.0–34.0)
MCHC: 32.8 g/dL (ref 30.0–36.0)
MCV: 80 fL (ref 80.0–100.0)
Platelets: 258 10*3/uL (ref 150–400)
RBC: 5.04 MIL/uL (ref 4.22–5.81)
RDW: 13.5 % (ref 11.5–15.5)
WBC: 5.6 10*3/uL (ref 4.0–10.5)
nRBC: 0 % (ref 0.0–0.2)

## 2019-03-30 LAB — COMPREHENSIVE METABOLIC PANEL
ALT: 28 U/L (ref 0–44)
AST: 21 U/L (ref 15–41)
Albumin: 4.1 g/dL (ref 3.5–5.0)
Alkaline Phosphatase: 63 U/L (ref 38–126)
Anion gap: 7 (ref 5–15)
BUN: 20 mg/dL (ref 6–20)
CO2: 23 mmol/L (ref 22–32)
Calcium: 9 mg/dL (ref 8.9–10.3)
Chloride: 109 mmol/L (ref 98–111)
Creatinine, Ser: 1.42 mg/dL — ABNORMAL HIGH (ref 0.61–1.24)
GFR calc Af Amer: >60 mL/min (ref >60)
GFR calc non Af Amer: 59 mL/min — ABNORMAL LOW (ref >60)
Glucose, Bld: 109 mg/dL — ABNORMAL HIGH (ref 70–99)
Potassium: 3.5 mmol/L (ref 3.5–5.1)
Sodium: 139 mmol/L (ref 135–145)
Total Bilirubin: 0.3 mg/dL (ref 0.3–1.2)
Total Protein: 7.2 g/dL (ref 6.5–8.1)

## 2019-03-30 LAB — ECHOCARDIOGRAM COMPLETE
Height: 70 in
Weight: 3840 oz

## 2019-03-30 LAB — HIV ANTIBODY (ROUTINE TESTING W REFLEX): HIV Screen 4th Generation wRfx: NONREACTIVE

## 2019-03-30 LAB — HEPARIN LEVEL (UNFRACTIONATED): Heparin Unfractionated: 0.99 IU/mL — ABNORMAL HIGH (ref 0.30–0.70)

## 2019-03-30 MED ORDER — ELIQUIS 5 MG VTE STARTER PACK: ORAL_TABLET | ORAL | 0 refills | Status: AC

## 2019-03-30 MED ORDER — METOPROLOL TARTRATE 50 MG PO TABS: 50.0000 mg | ORAL_TABLET | Freq: Two times a day (BID) | ORAL | 0 refills | Status: AC

## 2019-03-30 MED ORDER — GUAIFENESIN ER 600 MG PO TB12
600.0000 mg | ORAL_TABLET | Freq: Once | ORAL | Status: AC
  Administered 2019-03-30: 600 mg via ORAL
  Filled 2019-03-30: qty 1

## 2019-03-30 MED ORDER — HEPARIN (PORCINE) 25000 UT/250ML-% IV SOLN: 1700.0000 [IU]/h | INTRAVENOUS | Status: DC

## 2019-03-30 MED ORDER — CEPHALEXIN 500 MG PO CAPS: 500.0000 mg | ORAL_CAPSULE | Freq: Three times a day (TID) | ORAL | 0 refills | Status: AC

## 2019-03-30 MED ORDER — APIXABAN 5 MG PO TABS
10.0000 mg | ORAL_TABLET | Freq: Two times a day (BID) | ORAL | Status: DC
  Administered 2019-03-30: 10 mg via ORAL
  Filled 2019-03-30 (×2): qty 2

## 2019-03-30 MED ORDER — HEPARIN (PORCINE) 25000 UT/250ML-% IV SOLN: 1800.0000 [IU]/h | INTRAVENOUS | Status: DC

## 2019-03-30 NOTE — Progress Notes (Signed)
  Echocardiogram 2D Echocardiogram has been performed.  Leta Jungling M 03/30/2019, 8:12 AM

## 2019-03-30 NOTE — Discharge Summary (Signed)
Physician Discharge Summary  Bryan Woods ZOX:[id number] DOB: [birth year] DOA: 03/29/2019  PCP: Kristian Covey, MD  Admit date: 03/29/2019 Discharge date: 03/30/2019  Admitted From: Home Disposition: Home  Recommendations for Outpatient Follow-up:  1. Follow up with PCP in 1-2 weeks 2. Will refer to hematology as outpatient.  Home Health: None Equipment/Devices: Not applicable  Discharge Condition: Stable CODE STATUS: Full code Diet recommendation: Cardiac diet  Brief/Interim Summary: 48 year old gentleman with history of hypertension otherwise no chronic medical problems was admitted to the hospital with 2 weeks of shortness of breath and pleuritic chest pain.  He was found to have bilateral subsegmental pulmonary embolism.  He was also found with accelerated hypertension.  Discharge Diagnoses:  Active Problems:   Bilateral pulmonary embolism (HCC)   Hypertensive urgency   Lobar pneumonia (HCC)  Acute bilateral pulmonary embolism: Unprovoked.  Patient presented with chest pressure and shortness of breath.  No recent history of trauma or immobility.  Treated with heparin overnight.  Currently hemodynamically stable.  Walking in the hallway with normal oxygenation and heart rate. 2D echocardiogram essentially normal.  No right ventricular strain. Lower extremity ultrasounds negative for presence of acute DVT. Sister died of massive PE at the age of 86, 2 weeks after some surgery on her lower extremity.  Plan: Discussed in detail with patient.  Education done regarding use of anticoagulation.  We discussed with patient regarding availability of different anticoagulation agents to treat the present condition.  After discussing different injectable and oral choices, side effect profiles, reversibility and convenience of use, patient made informed decision to use apixaban.  Apixaban 10 mg twice a day for 7 days followed by 5 mg twice a day for at least 6 months without  interruption. Patient will need genetic testings, will refer to hematology for follow-up.  Accelerated hypertension: Blood pressures were not at goal.  Patient resumed his losartan.  Added metoprolol to his regimen.  Right middle lobe airspace disease: COVID negative ruled out.  Patient has minimal pulmonary symptoms.  Will treat with 5 days of Keflex for presumed pneumonia.     Discharge Instructions  Discharge Instructions    Ambulatory referral to Hematology   Complete by:  As directed    Unprovoked pulmonary embolism   Call MD for:  difficulty breathing, headache or visual disturbances   Complete by:  As directed    Call MD for:  severe uncontrolled pain   Complete by:  As directed    Diet - low sodium heart healthy   Complete by:  As directed    Discharge instructions   Complete by:  As directed    Take the blood thinner without missing any doses   educate and familiarize yourself about the blood thinner use.   Increase activity slowly   Complete by:  As directed      Allergies as of 03/30/2019   No Known Allergies     Medication List    TAKE these medications   cephALEXin 500 MG capsule Commonly known as:  KEFLEX Take 1 capsule (500 mg total) by mouth 3 (three) times daily for 5 days.   Eliquis DVT/PE Starter Pack 5 MG Tabs Take as directed on package: start with two-5mg  tablets twice daily for 7 days. On day 8, switch to one-5mg  tablet twice daily.   guaiFENesin 100 MG/5ML liquid Commonly known as:  ROBITUSSIN Take 200 mg by mouth at bedtime as needed for cough or congestion.   losartan 50 MG tablet Commonly known as:  COZAAR Take 1 tablet (50 mg total) by mouth daily.   metoprolol tartrate 50 MG tablet Commonly known as:  LOPRESSOR Take 1 tablet (50 mg total) by mouth 2 (two) times daily for 30 days.   omeprazole 20 MG capsule Commonly known as:  PRILOSEC Take 20 mg by mouth daily.      Follow-up Information    Kristian Covey, MD Follow up in 2  week(s).   Specialty:  Family Medicine Contact information: [address] [place] [address] [phone]          No Known Allergies  Consultations:  None   Procedures/Studies: Dg Chest 2 View  Result Date: 03/29/2019 CLINICAL DATA:  Chest pain shortness of breath EXAM: CHEST - 2 VIEW COMPARISON:  01/12/2015 FINDINGS: The heart size and mediastinal contours are within normal limits. Both lungs are clear. The visualized skeletal structures are unremarkable. IMPRESSION: No active cardiopulmonary disease. Electronically Signed   By: Katherine Mantle M.D.   On: 03/29/2019 14:31   Ct Angio Chest Pe W And/or Wo Contrast  Result Date: 03/29/2019 CLINICAL DATA:  Shortness of breath and chest pain EXAM: CT ANGIOGRAPHY CHEST WITH CONTRAST TECHNIQUE: Multidetector CT imaging of the chest was performed using the standard protocol during bolus administration of intravenous contrast. Multiplanar CT image reconstructions and MIPs were obtained to evaluate the vascular anatomy. CONTRAST:  OMNIPAQUE IOHEXOL 350 MG/ML SOLN COMPARISON:  Chest radiograph Mar 29, 2019 FINDINGS: Cardiovascular: There are foci of lower lobe pulmonary emboli bilaterally. No more central pulmonary emboli are evident. There is no demonstrable right heart strain. There is no thoracic aortic aneurysm or dissection. Visualized great vessels appear unremarkable. There is no pericardial effusion or pericardial thickening. There is mild left ventricular hypertrophy. Mediastinum/Nodes: Thyroid appears normal. No adenopathy is evident in the thoracic region. No esophageal lesions are appreciable. Lungs/Pleura: There is a small area of airspace opacity in the superior aspect of the medial segment right middle lobe consistent with a focus of pneumonia. The lungs elsewhere are clear. No pleural effusion or pleural thickening evident. No demonstrable pulmonary infarct. Upper Abdomen: There is incomplete visualization of a  cyst arising in the upper right kidney measuring 1.7 x 1.7 cm. Visualized upper abdominal structures otherwise appear unremarkable. Musculoskeletal: There are no blastic or lytic bone lesions. There are no evident chest wall lesions. Review of the MIP images confirms the above findings. IMPRESSION: 1. Several lower lobe pulmonary emboli bilaterally. No more central pulmonary embolus. No evident right heart strain. 2.  No thoracic aortic aneurysm or dissection. 3. Focal area of pneumonia in the right middle lobe superiorly and medially. Lungs elsewhere clear. 4.  No appreciable thoracic adenopathy. 5.  Mild left ventricular hypertrophy. Critical Value/emergent results were called by telephone at the time of interpretation on 03/29/2019 at 4:12 pm to Dr. Loren Racer , who verbally acknowledged these results. Electronically Signed   By: Bretta Bang III M.D.   On: 03/29/2019 16:13   Vas [place] Lower Extremity Venous (dvt)  Result Date: 03/30/2019  Lower Venous Study Risk Factors: Confirmed PE. Performing Technologist: Jeb Levering RDMS, RVT  Examination Guidelines: A complete evaluation includes B-mode imaging, spectral Doppler, color Doppler, and power Doppler as needed of all accessible portions of each vessel. Bilateral testing is considered an integral part of a complete examination. Limited examinations for reoccurring indications may be performed as noted.  +---------+---------------+---------+-----------+----------+-------+ RIGHT    CompressibilityPhasicitySpontaneityPropertiesSummary +---------+---------------+---------+-----------+----------+-------+ CFV      Full  Yes      Yes                          +---------+---------------+---------+-----------+----------+-------+ SFJ      Full                                                 +---------+---------------+---------+-----------+----------+-------+ FV Prox  Full                                                  +---------+---------------+---------+-----------+----------+-------+ FV Mid   Full                                                 +---------+---------------+---------+-----------+----------+-------+ FV DistalFull                                                 +---------+---------------+---------+-----------+----------+-------+ PFV      Full                                                 +---------+---------------+---------+-----------+----------+-------+ POP      Full           Yes      Yes                          +---------+---------------+---------+-----------+----------+-------+ PTV      Full                                                 +---------+---------------+---------+-----------+----------+-------+ PERO     Full                                                 +---------+---------------+---------+-----------+----------+-------+   +---------+---------------+---------+-----------+----------+-------+ LEFT     CompressibilityPhasicitySpontaneityPropertiesSummary +---------+---------------+---------+-----------+----------+-------+ CFV      Full           Yes      Yes                          +---------+---------------+---------+-----------+----------+-------+ SFJ      Full                                                 +---------+---------------+---------+-----------+----------+-------+ FV Prox  Full                                                 +---------+---------------+---------+-----------+----------+-------+  FV Mid   Full                                                 +---------+---------------+---------+-----------+----------+-------+ FV DistalFull                                                 +---------+---------------+---------+-----------+----------+-------+ PFV      Full                                                 +---------+---------------+---------+-----------+----------+-------+ POP      Full            Yes      Yes                          +---------+---------------+---------+-----------+----------+-------+ PTV      Full                                                 +---------+---------------+---------+-----------+----------+-------+ PERO     Full                                                 +---------+---------------+---------+-----------+----------+-------+     Summary: Right: There is no evidence of deep vein thrombosis in the lower extremity. No cystic structure found in the popliteal fossa. Left: There is no evidence of deep vein thrombosis in the lower extremity. No cystic structure found in the popliteal fossa.  *See table(s) above for measurements and observations.    Preliminary     Echocardiogram Lower extremity duplexes   Subjective: Patient was seen and examined on the day of discharge.  Denied any chest pain.  He does have mild dry cough.  Remains afebrile.  He was on a heparin drip overnight.  Walked in the hallway with no shortness of breath or drop in saturations.  Eager to go home.   Discharge Exam: Vitals:   03/30/19 0112 03/30/19 0543  BP: (!) 160/88 (!) 157/108  Pulse: 90 95  Resp:  18  Temp:  98.1 F (36.7 C)  SpO2:  95%   Vitals:   03/29/19 2110 03/29/19 2325 03/30/19 0112 03/30/19 0543  BP: (!) 186/120 (!) 172/106 (!) 160/88 (!) 157/108  Pulse: 76 78 90 95  Resp: 18   18  Temp: 97.8 F (36.6 C)   98.1 F (36.7 C)  TempSrc: Oral   Oral  SpO2: 95%   95%  Weight:      Height:        General: Pt is alert, awake, not in acute distress Cardiovascular: RRR, S1/S2 +, no rubs, no gallops Respiratory: CTA bilaterally, no wheezing, no rhonchi Abdominal: Soft, NT, ND, bowel sounds + Extremities: no edema, no cyanosis    The results of significant diagnostics from  this hospitalization (including imaging, microbiology, ancillary and laboratory) are listed below for reference.     Microbiology: Recent Results (from the past 240  hour(s))  SARS Coronavirus 2 (CEPHEID- Performed in [facility] hospital lab), Hosp Order     Status: None   Collection Time: 03/29/19  2:30 PM  Result Value Ref Range Status   SARS Coronavirus 2 NEGATIVE NEGATIVE Final    Comment: (NOTE) If result is NEGATIVE SARS-CoV-2 target nucleic acids are NOT DETECTED. The SARS-CoV-2 RNA is generally detectable in upper and lower  respiratory specimens during the acute phase of infection. The lowest  concentration of SARS-CoV-2 viral copies this assay can detect is 250  copies / mL. A negative result does not preclude SARS-CoV-2 infection  and should not be used as the sole basis for treatment or other  patient management decisions.  A negative result may occur with  improper specimen collection / handling, submission of specimen other  than nasopharyngeal swab, presence of viral mutation(s) within the  areas targeted by this assay, and inadequate number of viral copies  (<250 copies / mL). A negative result must be combined with clinical  observations, patient history, and epidemiological information. If result is POSITIVE SARS-CoV-2 target nucleic acids are DETECTED. The SARS-CoV-2 RNA is generally detectable in upper and lower  respiratory specimens dur ing the acute phase of infection.  Positive  results are indicative of active infection with SARS-CoV-2.  Clinical  correlation with patient history and other diagnostic information is  necessary to determine patient infection status.  Positive results do  not rule out bacterial infection or co-infection with other viruses. If result is PRESUMPTIVE POSTIVE SARS-CoV-2 nucleic acids MAY BE PRESENT.   A presumptive positive result was obtained on the submitted specimen  and confirmed on repeat testing.  While 2019 novel coronavirus  (SARS-CoV-2) nucleic acids may be present in the submitted sample  additional confirmatory testing may be necessary for epidemiological  and / or clinical  management purposes  to differentiate between  SARS-CoV-2 and other Sarbecovirus currently known to infect humans.  If clinically indicated additional testing with an alternate test  methodology [phone]([id number]) is advised. The SARS-CoV-2 RNA is generally  detectable in upper and lower respiratory sp ecimens during the acute  phase of infection. The expected result is Negative. Fact Sheet for Patients:  BoilerBrush.com.cyhttps://www.fda.gov/media/136312/download Fact Sheet for Healthcare Providers: https://pope.com/https://www.fda.gov/media/136313/download This test is not yet approved or cleared by the [place] FDA and has been authorized for detection and/or diagnosis of SARS-CoV-2 by FDA under an Emergency Use Authorization (EUA).  This EUA will remain in effect (meaning this test can be used) for the duration of the COVID-19 declaration under Section 564(b)(1) of the Act, 21 U.S.C. section 360bbb-3(b)(1), unless the authorization is terminated or revoked sooner. Performed at [facility], [address] [place], [place] [address]      Labs: BNP (last 3 results) Recent Labs    03/29/19 1418  BNP 22.7   Basic Metabolic Panel: Recent Labs  Lab 03/29/19 1417 03/30/19 0612  NA 141 139  K 4.0 3.5  CL 109 109  CO2 24 23  GLUCOSE 81 109*  BUN 19 20  CREATININE 1.53* 1.42*  CALCIUM 9.4 9.0   Liver Function Tests: Recent Labs  Lab 03/29/19 1417 03/30/19 0612  AST 24 21  ALT 33 28  ALKPHOS 66 63  BILITOT 0.4 0.3  PROT 7.6 7.2  ALBUMIN 4.5 4.1   Recent Labs  Lab 03/29/19  1417  LIPASE 37   No results for input(s): AMMONIA in the last 168 hours. CBC: Recent Labs  Lab 03/29/19 1418 03/30/19 0612  WBC 6.8 5.6  HGB 13.8 13.2  HCT 42.5 40.3  MCV 80.6 80.0  PLT 263 258   Cardiac Enzymes: Recent Labs  Lab 03/29/19 1418  TROPONINI <0.03   BNP: Invalid input(s): POCBNP CBG: No results for input(s): GLUCAP in the last 168 hours. D-Dimer Recent Labs    03/29/19 1417   DDIMER 2.63*   Hgb A1c No results for input(s): HGBA1C in the last 72 hours. Lipid Profile No results for input(s): CHOL, HDL, LDLCALC, TRIG, CHOLHDL, LDLDIRECT in the last 72 hours. Thyroid function studies No results for input(s): TSH, T4TOTAL, T3FREE, THYROIDAB in the last 72 hours.  Invalid input(s): FREET3 Anemia work up No results for input(s): VITAMINB12, FOLATE, FERRITIN, TIBC, IRON, RETICCTPCT in the last 72 hours. Urinalysis No results found for: COLORURINE, APPEARANCEUR, LABSPEC, PHURINE, GLUCOSEU, HGBUR, BILIRUBINUR, KETONESUR, PROTEINUR, UROBILINOGEN, NITRITE, LEUKOCYTESUR Sepsis Labs Invalid input(s): PROCALCITONIN,  WBC,  LACTICIDVEN Microbiology Recent Results (from the past 240 hour(s))  SARS Coronavirus 2 (CEPHEID- Performed in [facility] hospital lab), Hosp Order     Status: None   Collection Time: 03/29/19  2:30 PM  Result Value Ref Range Status   SARS Coronavirus 2 NEGATIVE NEGATIVE Final    Comment: (NOTE) If result is NEGATIVE SARS-CoV-2 target nucleic acids are NOT DETECTED. The SARS-CoV-2 RNA is generally detectable in upper and lower  respiratory specimens during the acute phase of infection. The lowest  concentration of SARS-CoV-2 viral copies this assay can detect is 250  copies / mL. A negative result does not preclude SARS-CoV-2 infection  and should not be used as the sole basis for treatment or other  patient management decisions.  A negative result may occur with  improper specimen collection / handling, submission of specimen other  than nasopharyngeal swab, presence of viral mutation(s) within the  areas targeted by this assay, and inadequate number of viral copies  (<250 copies / mL). A negative result must be combined with clinical  observations, patient history, and epidemiological information. If result is POSITIVE SARS-CoV-2 target nucleic acids are DETECTED. The SARS-CoV-2 RNA is generally detectable in upper and lower  respiratory  specimens dur ing the acute phase of infection.  Positive  results are indicative of active infection with SARS-CoV-2.  Clinical  correlation with patient history and other diagnostic information is  necessary to determine patient infection status.  Positive results do  not rule out bacterial infection or co-infection with other viruses. If result is PRESUMPTIVE POSTIVE SARS-CoV-2 nucleic acids MAY BE PRESENT.   A presumptive positive result was obtained on the submitted specimen  and confirmed on repeat testing.  While 2019 novel coronavirus  (SARS-CoV-2) nucleic acids may be present in the submitted sample  additional confirmatory testing may be necessary for epidemiological  and / or clinical management purposes  to differentiate between  SARS-CoV-2 and other Sarbecovirus currently known to infect humans.  If clinically indicated additional testing with an alternate test  methodology [phone]) is advised. The SARS-CoV-2 RNA is generally  detectable in upper and lower respiratory sp ecimens during the acute  phase of infection. The expected result is Negative. Fact Sheet for Patients:  BoilerBrush.com.cy Fact Sheet for Healthcare Providers: https://pope.com/ This test is not yet approved or cleared by the [place] FDA and has been authorized for detection and/or diagnosis of SARS-CoV-2 by FDA under an  Emergency Use Authorization (EUA).  This EUA will remain in effect (meaning this test can be used) for the duration of the COVID-19 declaration under Section 564(b)(1) of the Act, 21 U.S.C. section 360bbb-3(b)(1), unless the authorization is terminated or revoked sooner. Performed at [facility], [address], [place], [place] [address]      Time coordinating discharge:32  minutes  SIGNED:   Dorcas Carrow, MD  [facility] 03/30/2019, 12:58 PM Pager   If 7PM-7AM, please contact  night-coverage [email or web address] Password [email or web address]

## 2019-03-30 NOTE — Progress Notes (Signed)
ANTICOAGULATION CONSULT NOTE  Pharmacy Consult for IV heparin Indication: pulmonary embolus  No Known Allergies  Patient Measurements: Height: 5\' 10"  (177.8 cm) Weight: 240 lb (108.9 kg) IBW/kg (Calculated) : 73 Heparin Dosing Weight: 98.6  Vital Signs: Temp: 98.1 F (36.7 C) (05/13 0543) Temp Source: Oral (05/13 0543) BP: 157/108 (05/13 0543) Pulse Rate: 95 (05/13 0543)  Labs: Recent Labs    03/29/19 1417 03/29/19 1418 03/29/19 2256 03/30/19 0612  HGB  --  13.8  --  13.2  HCT  --  42.5  --  40.3  PLT  --  263  --  258  HEPARINUNFRC  --   --  0.22* 0.99*  CREATININE 1.53*  --   --  1.42*  TROPONINI  --  <0.03  --   --     Estimated Creatinine Clearance: 80.4 mL/min (A) (by C-G formula based on SCr of 1.42 mg/dL (H)).   Medications:  Scheduled:  . losartan  50 mg Oral Daily  . metoprolol tartrate  50 mg Oral BID   Infusions:  . cefTRIAXone (ROCEPHIN)  IV    . heparin 1,700 Units/hr (03/30/19 0754)    Assessment: 48 yo male presented to ED with SOB and CP x 2 weeks found to have PE. D-dimer elevated, but BNP and troponins WNL. To start IV heparin per pharmacy dosing.   Baseline INR, aPTT: not done   Prior anticoagulation: none  Significant events:  Today, 03/30/2019:  CBC: stable WNL  Most recent heparin level SUPRAtherapeutic on 2000 units/hr - RN confirmed lab draw on opposite arm from heparin infusion  No bleeding or other infusion issues per nursing  Goal of Therapy: Heparin level 0.3-0.7 units/ml Monitor platelets by anticoagulation protocol: Yes  Plan:  Reduce heparin IV infusion to 1800 units/hr  Check heparin level 6 hrs after rate change  Daily CBC, daily heparin level once stable  Monitor for signs of bleeding or thrombosis   Bernadene Person, PharmD, BCPS [phone] 03/30/2019, 7:58 AM

## 2019-03-30 NOTE — Progress Notes (Signed)
LE venous duplex       has been completed. Preliminary results can be found under CV proc through chart review. Jaycie Kregel, BS, RDMS, RVT   

## 2019-03-30 NOTE — TOC Benefit Eligibility Note (Addendum)
Transition of Care [facility]) Benefit Eligibility Note    Patient Details  Name: Bryan Woods MRN: [id number] Date of Birth: 07/18/1971     Per Med[employer] representative pt Med[employer] policy was termed on 03/18/2019. Pt also has [place]. Both Xarelto and Eliquis are on the [employer]erred [employer] formulary for [facility]. Pt does live in [place]. He would need to have prescription filled at a [place] pharmacy and copay would be between $3-$5. Both Pt and MD informed.     Bartholome Bill, RN Phone Number: 03/30/2019, 10:30 AM

## 2019-03-30 NOTE — Progress Notes (Signed)
Patient c/o sinus headache.  PCP was notified

## 2019-03-30 NOTE — Discharge Instructions (Signed)
Information on my medicine - ELIQUIS (apixaban)  This medication education was reviewed with me or my healthcare representative as part of my discharge preparation.  The pharmacist that spoke with me during my hospital stay was:  Aracelie Addis A, RPH  Why was Eliquis prescribed for you? Eliquis was prescribed to treat blood clots that may have been found in the veins of your legs (deep vein thrombosis) or in your lungs (pulmonary embolism) and to reduce the risk of them occurring again.  What do You need to know about Eliquis ? The starting dose is 10 mg (two 5 mg tablets) taken TWICE daily for the FIRST SEVEN (7) DAYS, then on (enter date)  04/06/2019  the dose is reduced to ONE 5 mg tablet taken TWICE daily.  Eliquis may be taken with or without food.   Try to take the dose about the same time in the morning and in the evening. If you have difficulty swallowing the tablet whole please discuss with your pharmacist how to take the medication safely.  Take Eliquis exactly as prescribed and DO NOT stop taking Eliquis without talking to the doctor who prescribed the medication.  Stopping may increase your risk of developing a new blood clot.  Refill your prescription before you run out.  After discharge, you should have regular check-up appointments with your healthcare provider that is prescribing your Eliquis.    What do you do if you miss a dose? If a dose of ELIQUIS is not taken at the scheduled time, take it as soon as possible on the same day and twice-daily administration should be resumed. The dose should not be doubled to make up for a missed dose.  Important Safety Information A possible side effect of Eliquis is bleeding. You should call your healthcare provider right away if you experience any of the following: ? Bleeding from an injury or your nose that does not stop. ? Unusual colored urine (red or dark brown) or unusual colored stools (red or black). ? Unusual bruising  for unknown reasons. ? A serious fall or if you hit your head (even if there is no bleeding).  Some medicines may interact with Eliquis and might increase your risk of bleeding or clotting while on Eliquis. To help avoid this, consult your healthcare provider or pharmacist prior to using any new prescription or non-prescription medications, including herbals, vitamins, non-steroidal anti-inflammatory drugs (NSAIDs) and supplements.  This website has more information on Eliquis (apixaban): http://www.eliquis.com/eliquis/home

## 2019-03-31 ENCOUNTER — Telehealth: Payer: Self-pay | Admitting: *Deleted

## 2019-03-31 NOTE — Telephone Encounter (Signed)
Transition Care Management Follow-up Telephone Call  Admit date: 03/29/2019 Discharge date: 03/30/2019  Admitted From: Home Disposition: Home  Recommendations for Outpatient Follow-up:  1. Follow up with PCP in 1-2 weeks 2. Will refer to hematology as outpatient.  Home Health: None Equipment/Devices: Not applicable  Discharge Condition: Stable CODE STATUS: Full code Diet recommendation: Cardiac diet   How have you been since you were released from the hospital? good   Do you understand why you were in the hospital? yes   Do you understand the discharge instructions? yes   Where were you discharged to? home   Items Reviewed:  Medications reviewed: yes  Allergies reviewed: yes  Dietary changes reviewed: yes - heart healthy  Referrals reviewed: yes   Functional Questionnaire:   Activities of Daily Living (ADLs):   He states they are independent in the following: none States they require assistance with the following: none   Any transportation issues/concerns?: no   Any patient concerns? no   Confirmed importance and date/time of follow-up visits scheduled yes  Provider Appointment booked with Dr Caryl Never 04/01/2019 at 3:13 pm  Confirmed with patient if condition begins to worsen call PCP or go to the ER.  Patient was given the office number and encouraged to call back with question or concerns.  : yes

## 2019-04-01 ENCOUNTER — Encounter: Payer: Self-pay | Admitting: Family Medicine

## 2019-04-01 ENCOUNTER — Ambulatory Visit (INDEPENDENT_AMBULATORY_CARE_PROVIDER_SITE_OTHER): Payer: Self-pay | Admitting: Family Medicine

## 2019-04-01 ENCOUNTER — Other Ambulatory Visit: Payer: Self-pay

## 2019-04-01 VITALS — BP 138/92 | HR 75 | Temp 98.5°F | Ht 70.0 in | Wt 251.5 lb

## 2019-04-01 DIAGNOSIS — I10 Essential (primary) hypertension: Secondary | ICD-10-CM

## 2019-04-01 DIAGNOSIS — I2699 Other pulmonary embolism without acute cor pulmonale: Secondary | ICD-10-CM

## 2019-04-01 MED ORDER — LOSARTAN POTASSIUM-HCTZ 50-12.5 MG PO TABS: 1.0000 | ORAL_TABLET | Freq: Every day | ORAL | 3 refills | Status: AC

## 2019-04-01 MED ORDER — APIXABAN 5 MG PO TABS: 5.0000 mg | ORAL_TABLET | Freq: Two times a day (BID) | ORAL | 5 refills | Status: AC

## 2019-04-01 NOTE — Patient Instructions (Signed)
Start the Losartan HCTZ.  May taper back the Metoprolol to one daily after starting the above  Monitor blood pressure and if consistently < 140/90 after once daily Metoprolol may then d/c the Metoprolol.

## 2019-04-01 NOTE — Progress Notes (Signed)
Subjective:     Patient ID: Bryan Woods, male   DOB: 09-May-1971, 48 y.o.   MRN: [id number]  HPI Patient seen for hospital follow-up.  He has history of hypertension.  He developed some shortness of breath couple weeks ago followed by some vague chest discomfort.  [facility] and [facility] evaluation.  Patient was diagnosed with bilateral pulmonary emboli.  There had been no prior history of recent trauma.  He was given heparin overnight.  He was hemodynamically stable.  Echocardiogram was normal.  No right ventricular strain.  Lower extremity Dopplers negative for acute DVT.  Patient was discharged on Eliquis with 10 mg twice daily for 1 week and then plan to transition to 5 mg twice daily.  No prior history of DVT or PE.  His sister though apparently died at age 47 of massive PE.  Patient has hypertension and has been taking losartan 50 mg daily.  Question of previous cough with ACE inhibitor.  He had hypertensive urgency with couple readings over 200 systolic.  They added metoprolol 50 mg twice daily.  However, he has had extreme fatigue since starting the beta-blocker.  Creatinine 1.4.  Unknown baseline.  He just established care with us recently we have no old records at this point.  CT showed question of right middle lobe airspace disease.  Patient was discharged on Keflex.  He denies any cough.  No fever.  Coronavirus screen negative.  Non-smoker.  No chronic lung disease.  Past Medical History:  Diagnosis Date  . Chronic bronchitis (HCC)   . Hypertension   . TIA (transient ischemic attack)    History reviewed. No pertinent surgical history.  reports that he has never smoked. He has never used smokeless tobacco. He reports that he does not drink alcohol or use drugs. family history is not on file. No Known Allergies   Review of Systems  Constitutional: Negative for chills and fever.  Respiratory: Positive for shortness of breath. Negative for cough  and wheezing.   Cardiovascular: Negative for chest pain, palpitations and leg swelling.  Gastrointestinal: Negative for abdominal pain, nausea and vomiting.  Genitourinary: Negative for hematuria.  Neurological: Negative for dizziness and syncope.       Objective:   Physical Exam Constitutional:      Appearance: Normal appearance.  Cardiovascular:     Rate and Rhythm: Normal rate and regular rhythm.  Pulmonary:     Effort: Pulmonary effort is normal.     Breath sounds: Normal breath sounds.  Musculoskeletal:     Right lower leg: No edema.     Left lower leg: No edema.  Neurological:     Mental Status: He is alert.        Assessment:     #1 bilateral pulmonary emboli.  Unprovoked.  Does have strong family history of PE with sister dying age 47 of PE.  Suspect likely has genetic predisposition Symptomatically improving on Eliquis  #2 hypertensive urgency.  Recent addition of metoprolol but patient has had side effect of pervasive fatigue  #3 question of right middle lobe pneumonia.  Patient on Keflex and afebrile    Plan:     -Continue Eliquis starter pack.  He will transition at the end of this week to 5 mg twice daily.  Prescription for Eliquis 5 mg twice daily given -He has follow-up scheduled May 22 with hematologist and is encouraged to keep that -We discussed blood pressure management.  He is having  significant fatigue issues of metoprolol.  We suggested switching over to losartan HCTZ 50/12.5 mg 1 daily.  He will try reducing his metoprolol back gradually to 1 daily and after several days of blood pressure stable one half daily and if consistently less than 140/90 then discontinue -Office follow-up in 3 to 4 weeks to reassess -Finish out Keflex -Follow-up immediately for any increased shortness of breath, fever, or other concerns  Bryan Covey MD [facility]

## 2019-04-08 ENCOUNTER — Inpatient Hospital Stay (HOSPITAL_COMMUNITY): Payer: Medicaid - Out of State | Attending: Hematology | Admitting: Hematology

## 2019-04-08 ENCOUNTER — Encounter (HOSPITAL_COMMUNITY): Payer: Self-pay | Admitting: Hematology

## 2019-04-08 ENCOUNTER — Other Ambulatory Visit: Payer: Self-pay

## 2019-04-08 VITALS — BP 117/92 | HR 77 | Temp 99.0°F | Resp 18 | Wt 250.8 lb

## 2019-04-08 DIAGNOSIS — J42 Unspecified chronic bronchitis: Secondary | ICD-10-CM | POA: Insufficient documentation

## 2019-04-08 DIAGNOSIS — I2699 Other pulmonary embolism without acute cor pulmonale: Secondary | ICD-10-CM | POA: Diagnosis not present

## 2019-04-08 DIAGNOSIS — Z8673 Personal history of transient ischemic attack (TIA), and cerebral infarction without residual deficits: Secondary | ICD-10-CM | POA: Diagnosis not present

## 2019-04-08 DIAGNOSIS — Z7901 Long term (current) use of anticoagulants: Secondary | ICD-10-CM | POA: Diagnosis not present

## 2019-04-08 DIAGNOSIS — Z803 Family history of malignant neoplasm of breast: Secondary | ICD-10-CM | POA: Insufficient documentation

## 2019-04-08 DIAGNOSIS — I10 Essential (primary) hypertension: Secondary | ICD-10-CM | POA: Insufficient documentation

## 2019-04-08 DIAGNOSIS — Z79899 Other long term (current) drug therapy: Secondary | ICD-10-CM | POA: Diagnosis not present

## 2019-04-08 NOTE — Assessment & Plan Note (Signed)
1.  Unprovoked pulmonary embolism: - Presented to the hospital on 03/29/2019 with dyspnea on exertion and chest pressure.  He was very active prior to presentation to the hospital. -CT PE protocol showed several lower lobe pulmonary emboli bilaterally with no evidence of right heart strain. -Lower extremity Doppler on 03/30/2019 did not show any DVT. -2D echocardiogram on 03/30/2019 shows LVEF of 60 to 65%.  There is severely increased left ventricular wall thickness.  Impaired relaxation of left ventricle.  Right ventricle has normal systolic function. - Patient reportedly had a TIA in 2010 when he was diagnosed with hypertension. -Family history was significant for sister who died of pulmonary embolism at age 75.  She had 1 or 2 miscarriages. -He is currently on Eliquis and is tolerating it very well.  No bleeding episodes. - Based on the unprovoked nature of his pulmonary embolism, I have recommended indefinite anticoagulation at this time. -I will see him back in 4 months for evaluation.  2.  Family history: - Sister died of pulmonary embolism at age 4, after sustaining a ankle injury few days prior.  She reportedly had few miscarriages. -Maternal grandmother had breast cancer.  Maternal aunt had stomach cancer.  Maternal uncle had cancer.

## 2019-04-08 NOTE — Progress Notes (Signed)
[facility] CONSULT NOTE  Patient Care Team: Kristian Covey, MD as PCP - General (Family Medicine)  CHIEF COMPLAINTS/PURPOSE OF CONSULTATION:  Pulmonary embolism  HISTORY OF PRESENTING ILLNESS:  Bryan Woods 48 y.o. male is seen in consultation today for further work-up and management of pulmonary embolism.  He presented to [facility] hospital on 03/29/2019 with few days of dyspnea on exertion and chest pressure.  Prior to that he was playing basketball with his 2 sons.  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  He lives in [place].  His d-dimer was found to be high.  He will had a CT scan done which showed bilateral pulmonary emboli.  Venous Doppler did not show any DVT.  He was admitted to the hospital for a few days and anticoagulated with heparin.  This was transitioned to Eliquis.  He is tolerating Eliquis twice daily very well.  He does not report any bleeding episodes.  His chest pressure has improved.  He reports having TIA in 2010 and was diagnosed with hypertension.  He also had uncontrolled hypertension when he was in the hospital.  He also has a family history with sister who died at 35 with pulmonary embolism.  There is also family members on his mother's side who had cancers.  He does not report any fevers, night sweats or weight loss in the last 6 months.  Appetite is 100%.  Energy levels are 75%.  No pain reported.  MEDICAL HISTORY:  Past Medical History:  Diagnosis Date  . Chronic bronchitis (HCC)   . Hypertension   . TIA (transient ischemic attack)     SURGICAL HISTORY: History reviewed. No pertinent surgical history.  SOCIAL HISTORY: Social History   Socioeconomic History  . Marital status: Married    Spouse name: Not on file  . Number of children: Not on file  . Years of education: Not on file  . Highest education level: Not on file  Occupational History  . Not on file  Social Needs  . Financial resource  strain: Not on file  . Food insecurity:    Worry: Not on file    Inability: Not on file  . Transportation needs:    Medical: Not on file    Non-medical: Not on file  Tobacco Use  . Smoking status: Never Smoker  . Smokeless tobacco: Never Used  Substance and Sexual Activity  . Alcohol use: No  . Drug use: No  . Sexual activity: Yes  Lifestyle  . Physical activity:    Days per week: Not on file    Minutes per session: Not on file  . Stress: Not on file  Relationships  . Social connections:    Talks on phone: Not on file    Gets together: Not on file    Attends religious service: Not on file    Active member of club or organization: Not on file    Attends meetings of clubs or organizations: Not on file    Relationship status: Not on file  . Intimate partner violence:    Fear of current or ex partner: Not on file    Emotionally abused: Not on file    Physically abused: Not on file    Forced sexual activity: Not on file  Other Topics Concern  . Not on file  Social History Narrative  . Not on file    FAMILY HISTORY: History reviewed. No pertinent family history.  ALLERGIES:  has No Known Allergies.  MEDICATIONS:  Current Outpatient Medications  Medication Sig Dispense Refill  . apixaban (ELIQUIS) 5 MG TABS tablet Take 1 tablet (5 mg total) by mouth 2 (two) times daily. 60 tablet 5  . Eliquis DVT/PE Starter Pack (ELIQUIS STARTER PACK) 5 MG TABS Take as directed on package: start with two-5mg  tablets twice daily for 7 days. On day 8, switch to one-5mg  tablet twice daily. 1 each 0  . guaiFENesin (ROBITUSSIN) 100 MG/5ML liquid Take 200 mg by mouth at bedtime as needed for cough or congestion.    Marland Kitchen losartan-hydrochlorothiazide (HYZAAR) 50-12.5 MG tablet Take 1 tablet by mouth daily. 90 tablet 3  . metoprolol tartrate (LOPRESSOR) 50 MG tablet Take 1 tablet (50 mg total) by mouth 2 (two) times daily for 30 days. 60 tablet 0  . omeprazole (PRILOSEC) 20 MG capsule Take 20 mg by  mouth daily.     No current facility-administered medications for this visit.     REVIEW OF SYSTEMS:   Constitutional: Denies fevers, chills or abnormal night sweats Eyes: Denies blurriness of vision, double vision or watery eyes Ears, nose, mouth, throat, and face: Denies mucositis or sore throat Respiratory: He does report shortness of breath on exertion at sometimes. Cardiovascular: Denies palpitation, chest discomfort or lower extremity swelling Gastrointestinal:  Denies nausea, heartburn or change in bowel habits Skin: Denies abnormal skin rashes Lymphatics: Denies new lymphadenopathy or easy bruising Neurological:Denies numbness, tingling or new weaknesses Behavioral/Psych: Mood is stable, no new changes  All other systems were reviewed with the patient and are negative.  PHYSICAL EXAMINATION: ECOG PERFORMANCE STATUS: 0 - Asymptomatic  Vitals:   04/08/19 1408  BP: (!) 117/92  Pulse: 77  Resp: 18  Temp: 99 F (37.2 C)  SpO2: 98%   Filed Weights   04/08/19 1408  Weight: 250 lb 12.8 oz (113.8 kg)    GENERAL:alert, no distress and comfortable SKIN: skin color, texture, turgor are normal, no rashes or significant lesions EYES: normal, conjunctiva are pink and non-injected, sclera clear OROPHARYNX:no exudate, no erythema and lips, buccal mucosa, and tongue normal  NECK: supple, thyroid normal size, non-tender, without nodularity LYMPH:  no palpable lymphadenopathy in the cervical, axillary or inguinal LUNGS: clear to auscultation and percussion with normal breathing effort HEART: regular rate & rhythm and no murmurs and no lower extremity edema ABDOMEN:abdomen soft, non-tender and normal bowel sounds Musculoskeletal:no cyanosis of digits and no clubbing  PSYCH: alert & oriented x 3 with fluent speech NEURO: no focal motor/sensory deficits  LABORATORY DATA:  I have reviewed the data as listed Lab Results  Component Value Date   WBC 5.6 03/30/2019   HGB 13.2  03/30/2019   HCT 40.3 03/30/2019   MCV 80.0 03/30/2019   PLT 258 03/30/2019     Chemistry      Component Value Date/Time   NA 139 03/30/2019 0612   K 3.5 03/30/2019 0612   CL 109 03/30/2019 0612   CO2 23 03/30/2019 0612   BUN 20 03/30/2019 0612   CREATININE 1.42 (H) 03/30/2019 0612      Component Value Date/Time   CALCIUM 9.0 03/30/2019 0612   ALKPHOS 63 03/30/2019 0612   AST 21 03/30/2019 0612   ALT 28 03/30/2019 0612   BILITOT 0.3 03/30/2019 0612       RADIOGRAPHIC STUDIES: I have personally reviewed the radiological images as listed and agreed with the findings in the report. Dg Chest 2 View  Result Date: 03/29/2019 CLINICAL DATA:  Chest  pain shortness of breath EXAM: CHEST - 2 VIEW COMPARISON:  01/12/2015 FINDINGS: The heart size and mediastinal contours are within normal limits. Both lungs are clear. The visualized skeletal structures are unremarkable. IMPRESSION: No active cardiopulmonary disease. Electronically Signed   By: Katherine Mantle M.D.   On: 03/29/2019 14:31   Ct Angio Chest Pe W And/or Wo Contrast  Result Date: 03/29/2019 CLINICAL DATA:  Shortness of breath and chest pain EXAM: CT ANGIOGRAPHY CHEST WITH CONTRAST TECHNIQUE: Multidetector CT imaging of the chest was performed using the standard protocol during bolus administration of intravenous contrast. Multiplanar CT image reconstructions and MIPs were obtained to evaluate the vascular anatomy. CONTRAST:  OMNIPAQUE IOHEXOL 350 MG/ML SOLN COMPARISON:  Chest radiograph Mar 29, 2019 FINDINGS: Cardiovascular: There are foci of lower lobe pulmonary emboli bilaterally. No more central pulmonary emboli are evident. There is no demonstrable right heart strain. There is no thoracic aortic aneurysm or dissection. Visualized great vessels appear unremarkable. There is no pericardial effusion or pericardial thickening. There is mild left ventricular hypertrophy. Mediastinum/Nodes: Thyroid appears normal. No adenopathy  is evident in the thoracic region. No esophageal lesions are appreciable. Lungs/Pleura: There is a small area of airspace opacity in the superior aspect of the medial segment right middle lobe consistent with a focus of pneumonia. The lungs elsewhere are clear. No pleural effusion or pleural thickening evident. No demonstrable pulmonary infarct. Upper Abdomen: There is incomplete visualization of a cyst arising in the upper right kidney measuring 1.7 x 1.7 cm. Visualized upper abdominal structures otherwise appear unremarkable. Musculoskeletal: There are no blastic or lytic bone lesions. There are no evident chest wall lesions. Review of the MIP images confirms the above findings. IMPRESSION: 1. Several lower lobe pulmonary emboli bilaterally. No more central pulmonary embolus. No evident right heart strain. 2.  No thoracic aortic aneurysm or dissection. 3. Focal area of pneumonia in the right middle lobe superiorly and medially. Lungs elsewhere clear. 4.  No appreciable thoracic adenopathy. 5.  Mild left ventricular hypertrophy. Critical Value/emergent results were called by telephone at the time of interpretation on 03/29/2019 at 4:12 pm to Dr. Loren Racer , who verbally acknowledged these results. Electronically Signed   By: Bretta Bang III M.D.   On: 03/29/2019 16:13   Vas [place] Lower Extremity Venous (dvt)  Result Date: 03/30/2019  Lower Venous Study Risk Factors: Confirmed PE. Performing Technologist: Jeb Levering RDMS, RVT  Examination Guidelines: A complete evaluation includes B-mode imaging, spectral Doppler, color Doppler, and power Doppler as needed of all accessible portions of each vessel. Bilateral testing is considered an integral part of a complete examination. Limited examinations for reoccurring indications may be performed as noted.  +---------+---------------+---------+-----------+----------+-------+ RIGHT    CompressibilityPhasicitySpontaneityPropertiesSummary  +---------+---------------+---------+-----------+----------+-------+ CFV      Full           Yes      Yes                          +---------+---------------+---------+-----------+----------+-------+ SFJ      Full                                                 +---------+---------------+---------+-----------+----------+-------+ FV Prox  Full                                                 +---------+---------------+---------+-----------+----------+-------+  FV Mid   Full                                                 +---------+---------------+---------+-----------+----------+-------+ FV DistalFull                                                 +---------+---------------+---------+-----------+----------+-------+ PFV      Full                                                 +---------+---------------+---------+-----------+----------+-------+ POP      Full           Yes      Yes                          +---------+---------------+---------+-----------+----------+-------+ PTV      Full                                                 +---------+---------------+---------+-----------+----------+-------+ PERO     Full                                                 +---------+---------------+---------+-----------+----------+-------+   +---------+---------------+---------+-----------+----------+-------+ LEFT     CompressibilityPhasicitySpontaneityPropertiesSummary +---------+---------------+---------+-----------+----------+-------+ CFV      Full           Yes      Yes                          +---------+---------------+---------+-----------+----------+-------+ SFJ      Full                                                 +---------+---------------+---------+-----------+----------+-------+ FV Prox  Full                                                 +---------+---------------+---------+-----------+----------+-------+ FV Mid   Full                                                  +---------+---------------+---------+-----------+----------+-------+ FV DistalFull                                                 +---------+---------------+---------+-----------+----------+-------+ PFV  Full                                                 +---------+---------------+---------+-----------+----------+-------+ POP      Full           Yes      Yes                          +---------+---------------+---------+-----------+----------+-------+ PTV      Full                                                 +---------+---------------+---------+-----------+----------+-------+ PERO     Full                                                 +---------+---------------+---------+-----------+----------+-------+     Summary: Right: There is no evidence of deep vein thrombosis in the lower extremity. No cystic structure found in the popliteal fossa. Left: There is no evidence of deep vein thrombosis in the lower extremity. No cystic structure found in the popliteal fossa.  *See table(s) above for measurements and observations. Electronically signed by Coral ElseVance Brabham MD on 03/30/2019 at 3:33:38 PM.    Final     ASSESSMENT & PLAN:  Bilateral pulmonary embolism (HCC) 1.  Unprovoked pulmonary embolism: - Presented to the hospital on 03/29/2019 with dyspnea on exertion and chest pressure.  He was very active prior to presentation to the hospital. -CT PE protocol showed several lower lobe pulmonary emboli bilaterally with no evidence of right heart strain. -Lower extremity Doppler on 03/30/2019 did not show any DVT. -2D echocardiogram on 03/30/2019 shows LVEF of 60 to 65%.  There is severely increased left ventricular wall thickness.  Impaired relaxation of left ventricle.  Right ventricle has normal systolic function. - Patient reportedly had a TIA in 2010 when he was diagnosed with hypertension. -Family history was significant  for sister who died of pulmonary embolism at age 47.  She had 1 or 2 miscarriages. -He is currently on Eliquis and is tolerating it very well.  No bleeding episodes. - Based on the unprovoked nature of his pulmonary embolism, I have recommended indefinite anticoagulation at this time. -I will see him back in 4 months for evaluation.  2.  Family history: - Sister died of pulmonary embolism at age 47, after sustaining a ankle injury few days prior.  She reportedly had few miscarriages. -Maternal grandmother had breast cancer.  Maternal aunt had stomach cancer.  Maternal uncle had cancer.  Orders Placed This Encounter  Procedures  . CBC with Differential    Standing Status:   Future    Standing Expiration Date:   04/07/2020  . Comprehensive metabolic panel    Standing Status:   Future    Standing Expiration Date:   04/07/2020  . D-dimer, quantitative    Standing Status:   Future    Standing Expiration Date:   04/07/2020  . Lupus anticoagulant panel    Standing Status:   Future    Standing Expiration Date:   04/07/2020  All questions were answered. The patient knows to call the clinic with any problems, questions or concerns.     Doreatha Massed, MD 04/08/2019 4:09 PM

## 2019-04-22 ENCOUNTER — Ambulatory Visit: Payer: Self-pay | Admitting: Family Medicine

## 2019-08-02 ENCOUNTER — Inpatient Hospital Stay (HOSPITAL_COMMUNITY): Payer: BLUE CROSS/BLUE SHIELD | Attending: Hematology

## 2019-08-09 ENCOUNTER — Ambulatory Visit (HOSPITAL_COMMUNITY): Payer: Self-pay | Admitting: Hematology

## 2020-05-16 IMAGING — CR CHEST - 2 VIEW
2 series · 2 of 2 positions shown · non-contrast
Comparison: 01/12/2015

CLINICAL DATA: Chest pain shortness of breath

EXAM:
CHEST - 2 VIEW

[w chest pa]
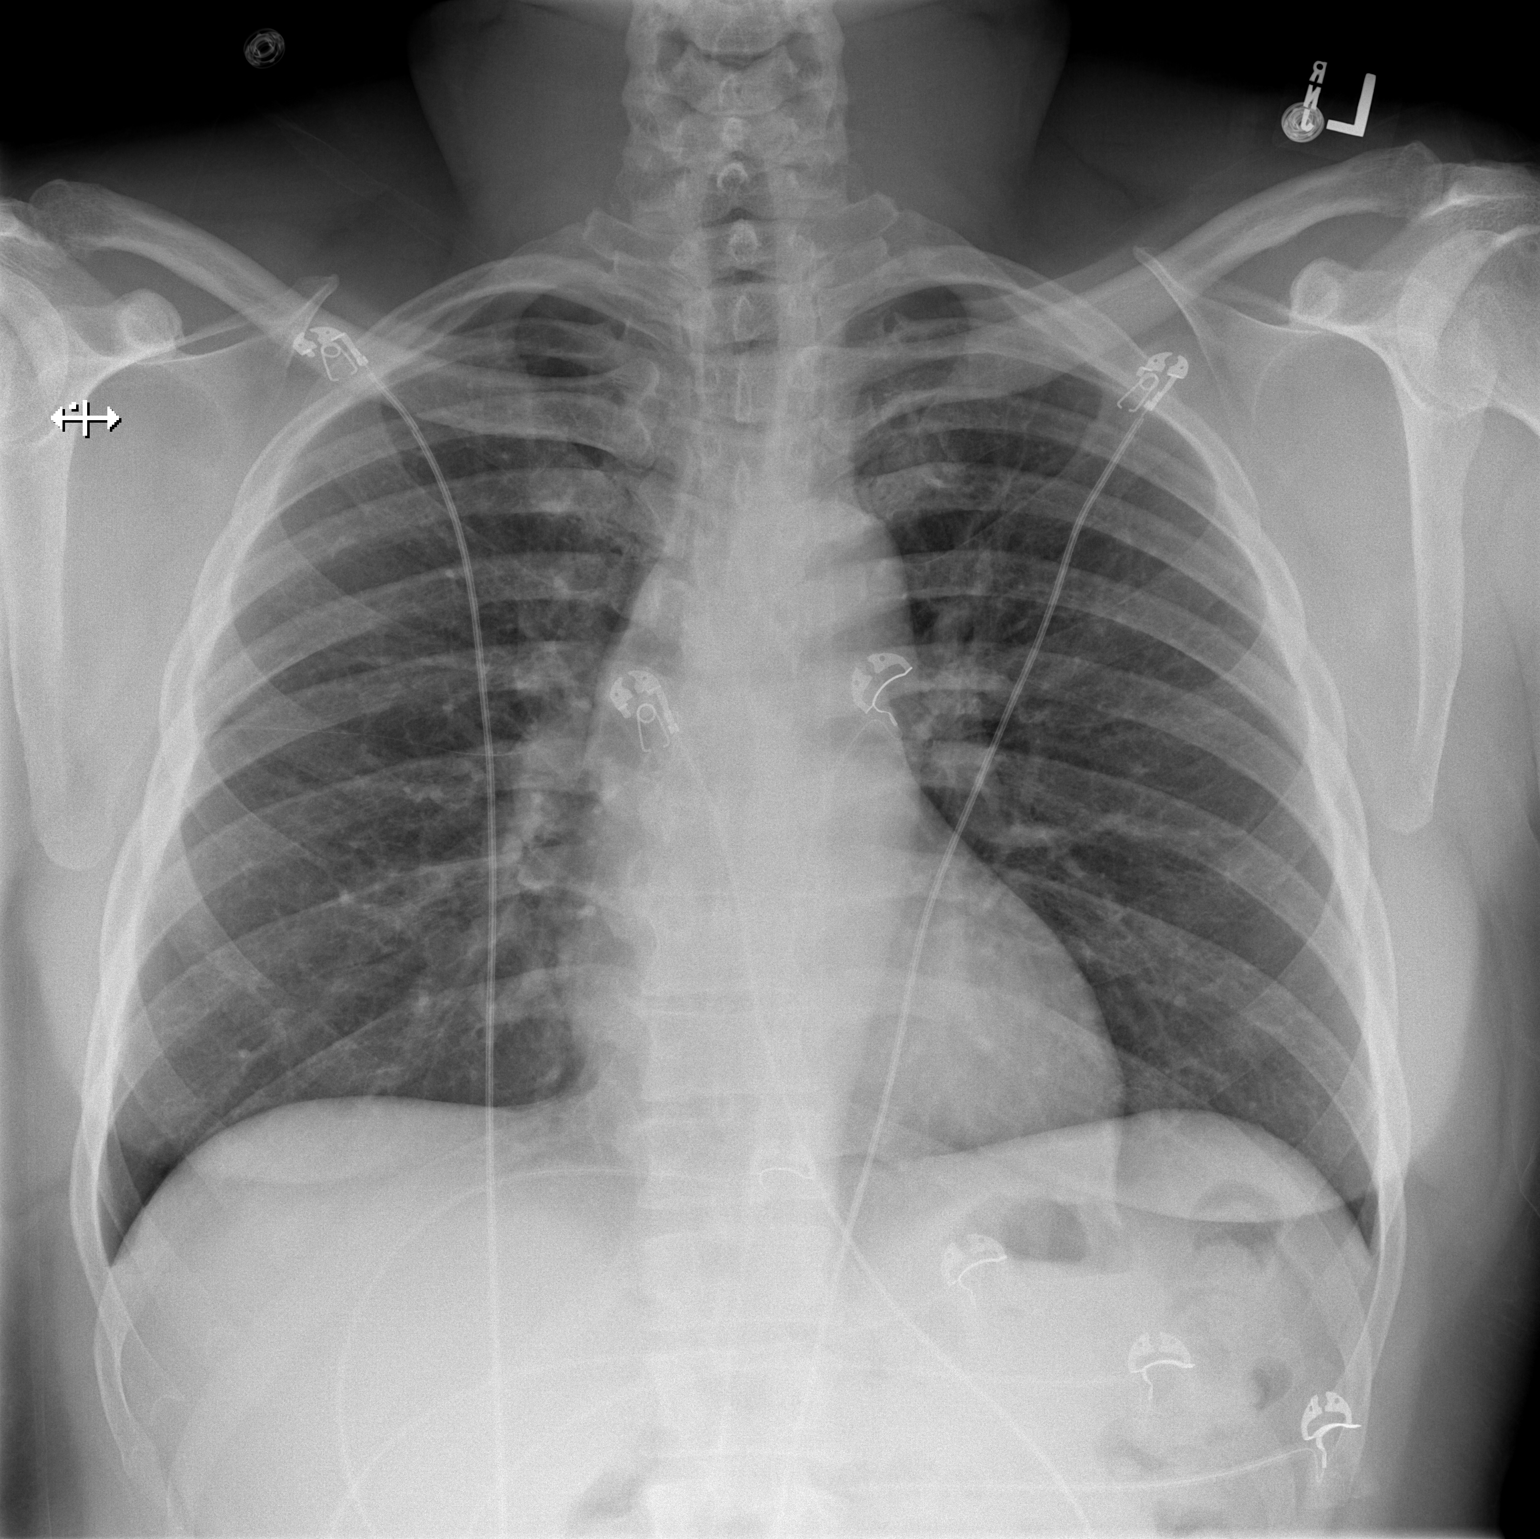

[w chest lat]
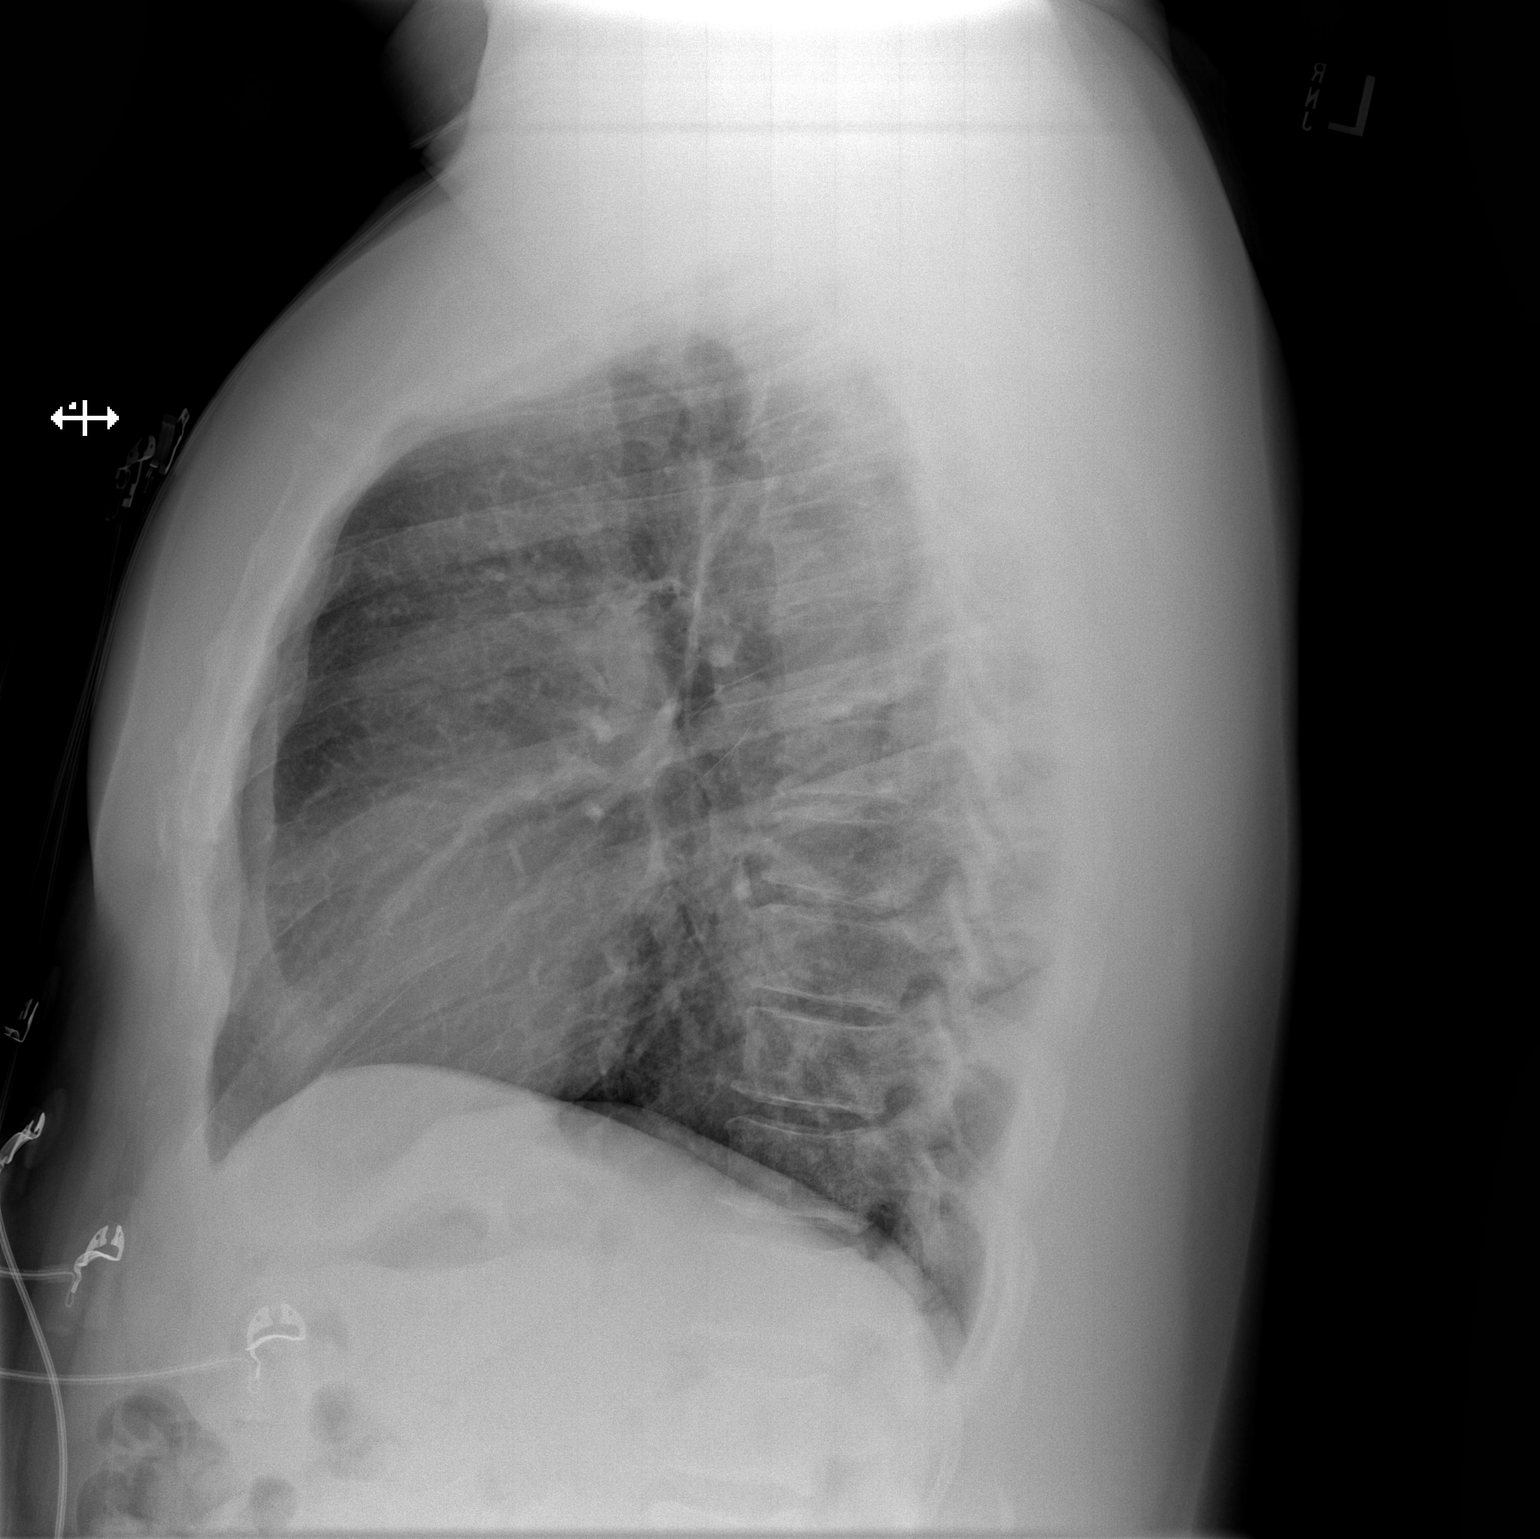

[2 of 2 positions shown; findings below may reference images not displayed]

FINDINGS: The heart size and mediastinal contours are within normal limits.
Both lungs are clear. The visualized skeletal structures are
unremarkable.
IMPRESSION: No active cardiopulmonary disease.

## 2020-05-16 IMAGING — CT CT ANGIOGRAPHY CHEST
2 of 7 series · 17 of 46 positions shown · IV contrast (ISOVUE)
Comparison: Chest radiograph March 29, 2019

CLINICAL DATA: Shortness of breath and chest pain

EXAM:
CT ANGIOGRAPHY CHEST WITH CONTRAST
TECHNIQUE: Multidetector CT imaging of the chest was performed using the
standard protocol during bolus administration of intravenous
contrast. Multiplanar CT image reconstructions and MIPs were
obtained to evaluate the vascular anatomy.
CONTRAST:  100mL OMNIPAQUE IOHEXOL 350 MG/ML SOLN

[Series 5: thins · axial · 0.90mm/px · z∈[+93,+338]mm · 14 of 279 slices shown]
[im 17/279  lung]
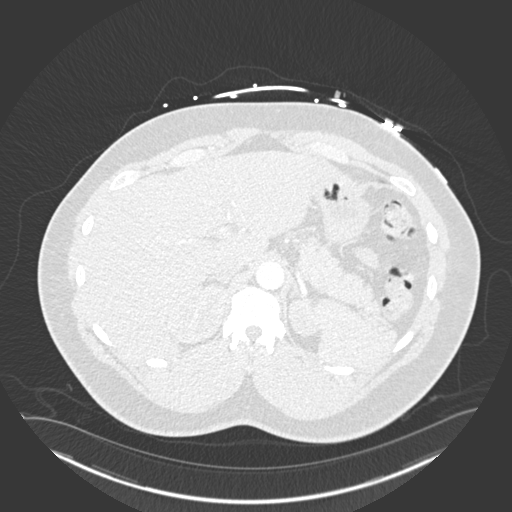
[im 33/279  soft-tissue]
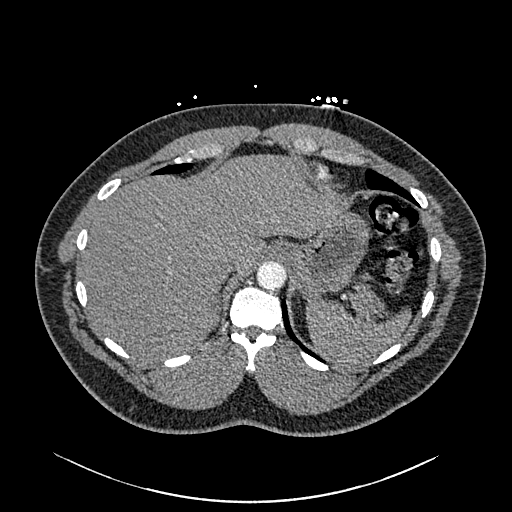
[im 50/279  lung]
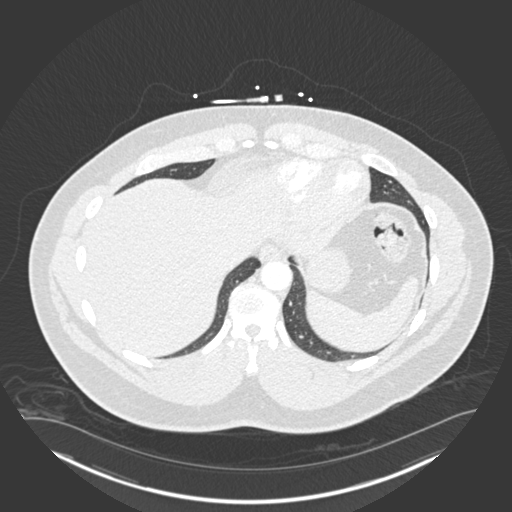
[im 82/279  soft-tissue]
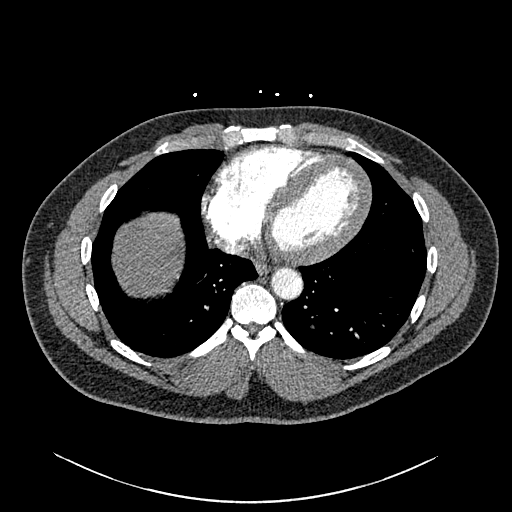
[im 99/279  lung]
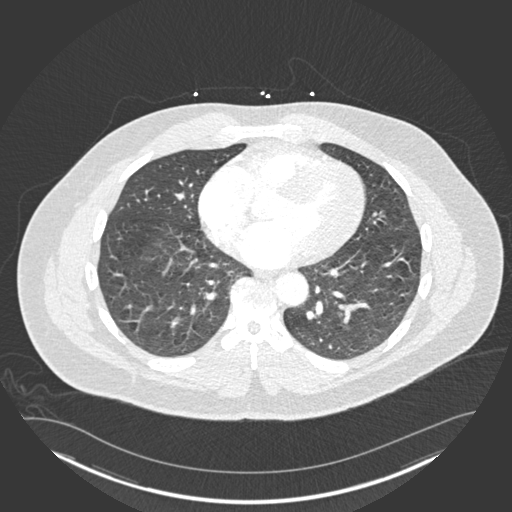
[im 115/279  soft-tissue]
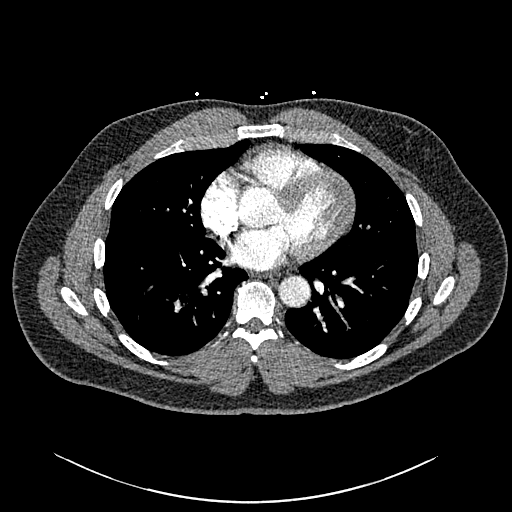
[im 131/279  lung]
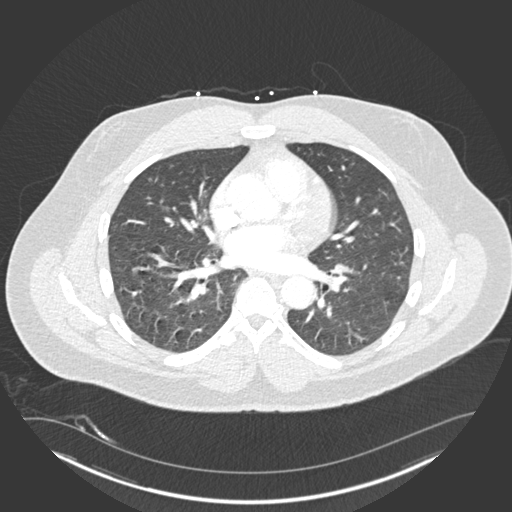
[im 148/279  soft-tissue]
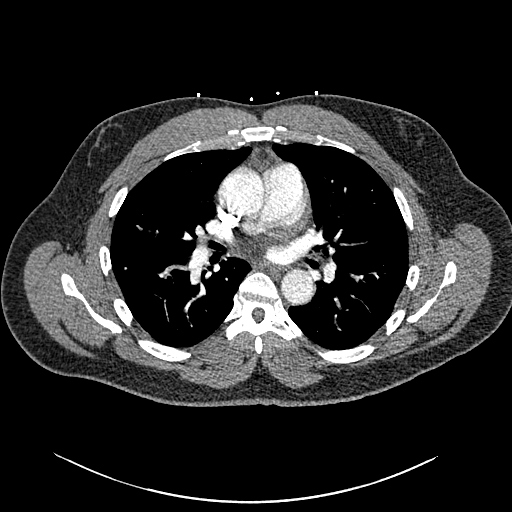
[im 164/279  lung]
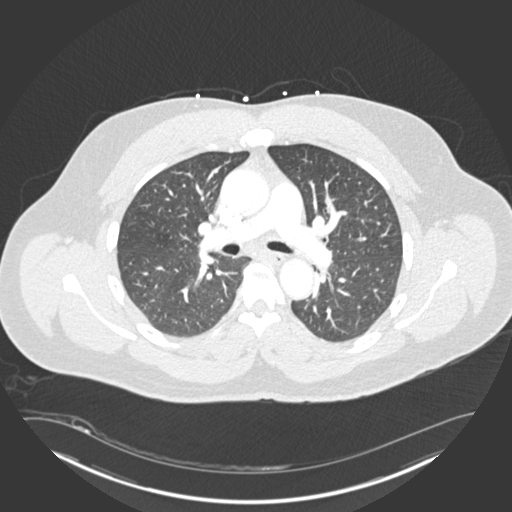
[im 180/279  soft-tissue]
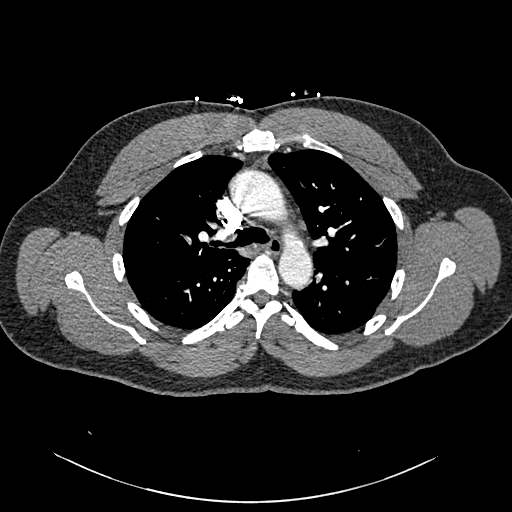
[im 213/279  lung]
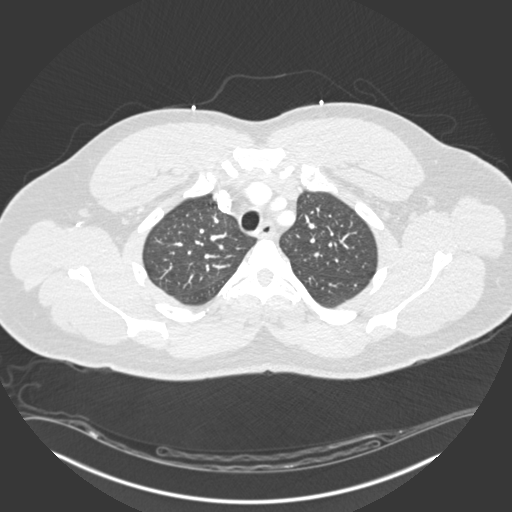
[im 229/279  soft-tissue]
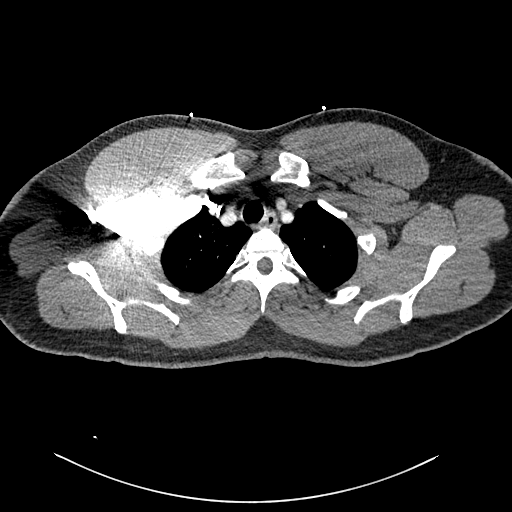
[im 246/279  lung]
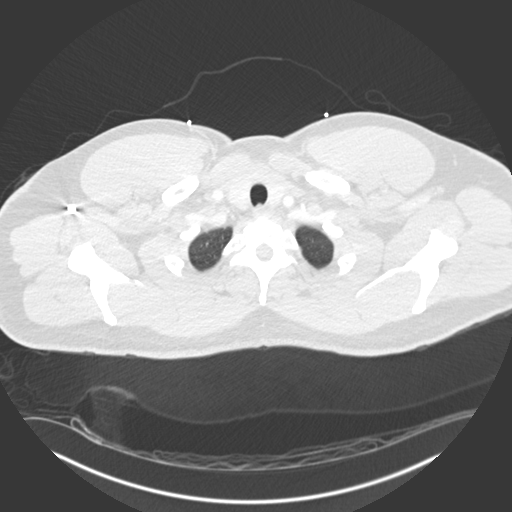
[im 262/279  soft-tissue]
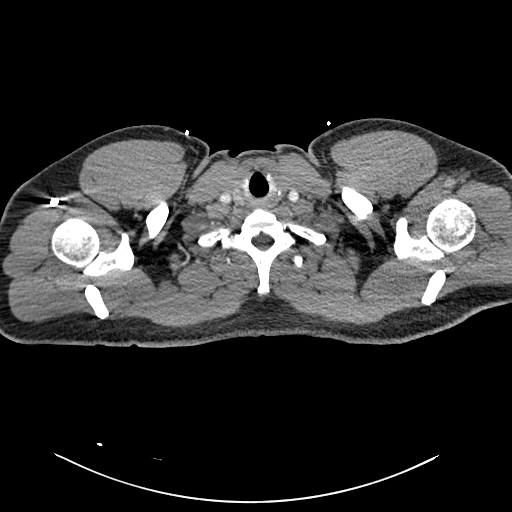

[Series 6: coronal mpr · coronal · 0.58mm/px · 3 of 156 slices shown]
[im 39/156  soft-tissue]
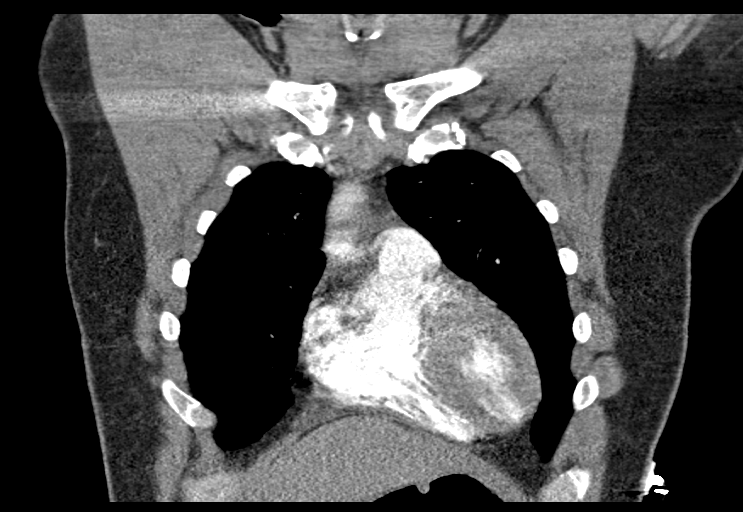
[im 78/156  soft-tissue]
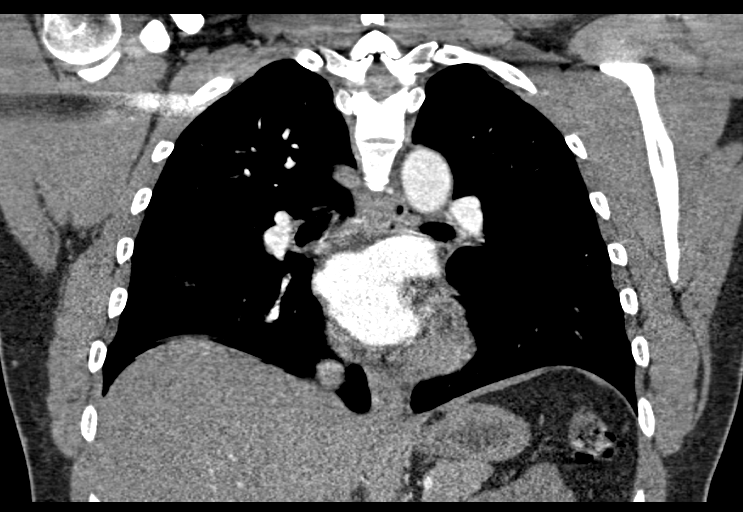
[im 117/156  soft-tissue]
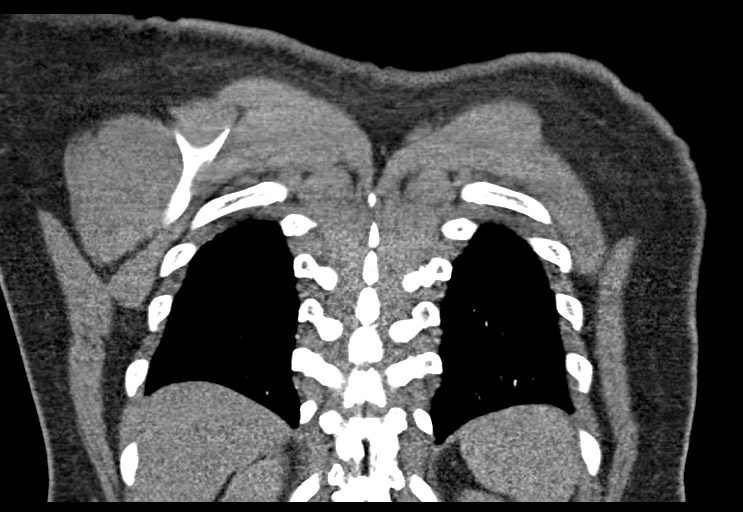

[17 of 46 positions shown; findings below may reference images not displayed]

FINDINGS: Cardiovascular: There are foci of lower lobe pulmonary emboli
bilaterally. No more central pulmonary emboli are evident. There is
no demonstrable right heart strain.

There is no thoracic aortic aneurysm or dissection. Visualized great
vessels appear unremarkable. There is no pericardial effusion or
pericardial thickening. There is mild left ventricular hypertrophy.

Mediastinum/Nodes: Thyroid appears normal. No adenopathy is evident
in the thoracic region. No esophageal lesions are appreciable.

Lungs/Pleura: There is a small area of airspace opacity in the
superior aspect of the medial segment right middle lobe consistent
with a focus of pneumonia. The lungs elsewhere are clear. No pleural
effusion or pleural thickening evident. No demonstrable pulmonary
infarct.

Upper Abdomen: There is incomplete visualization of a cyst arising
in the upper right kidney measuring 1.7 x 1.7 cm. Visualized upper
abdominal structures otherwise appear unremarkable.

Musculoskeletal: There are no blastic or lytic bone lesions. There
are no evident chest wall lesions.

Review of the MIP images confirms the above findings.
IMPRESSION: 1. Several lower lobe pulmonary emboli bilaterally. No more central
pulmonary embolus. No evident right heart strain.

2.  No thoracic aortic aneurysm or dissection.

3. Focal area of pneumonia in the right middle lobe superiorly and
medially. Lungs elsewhere clear.

4.  No appreciable thoracic adenopathy.

5.  Mild left ventricular hypertrophy.

Critical Value/emergent results were called by telephone at the time
of interpretation on 03/29/2019 at [DATE] to Dr. DEMO HERMAN ,
who verbally acknowledged these results.

## 2020-07-13 ENCOUNTER — Ambulatory Visit: Payer: BLUE CROSS/BLUE SHIELD | Admitting: Podiatry

## 2020-07-27 ENCOUNTER — Ambulatory Visit: Payer: Medicaid Other | Admitting: Podiatry

## 2020-10-06 ENCOUNTER — Ambulatory Visit: Payer: BLUE CROSS/BLUE SHIELD

## 2020-10-27 ENCOUNTER — Ambulatory Visit: Payer: Medicaid - Out of State | Attending: Internal Medicine

## 2020-10-27 DIAGNOSIS — Z23 Encounter for immunization: Secondary | ICD-10-CM

## 2020-10-27 NOTE — Progress Notes (Signed)
° °  Covid-19 Vaccination Clinic  Name:  Bryan Woods    MRN: [id number] DOB: 06/18/1971  10/27/2020  [name] was observed post Covid-19 immunization for 15 minutes without incident. He was provided with Vaccine Information Sheet and instruction to access the V-Safe system.   [name] was instructed to call 911 with any severe reactions post vaccine:  Difficulty breathing   Swelling of face and throat   A fast heartbeat   A bad rash all over body   Dizziness and weakness   Immunizations Administered    Name Date Dose VIS Date Route   Pfizer COVID-19 Vaccine 10/27/2020 10:23 AM 0.3 mL 09/05/2020 Intramuscular   Manufacturer: [employer]   Lot: [id number]   NDC: [id number]

## 2020-11-19 ENCOUNTER — Ambulatory Visit: Payer: Medicaid - Out of State | Attending: Internal Medicine

## 2020-11-19 DIAGNOSIS — Z23 Encounter for immunization: Secondary | ICD-10-CM

## 2020-11-19 NOTE — Progress Notes (Signed)
   Covid-19 Vaccination Clinic  Name:  Bryan Woods    MRN: [id number] DOB: 1971/09/04  11/19/2020  Mr. Bryan Woods was observed post Covid-19 immunization for 15 minutes without incident. He was provided with Vaccine Information Sheet and instruction to access the V-Safe system.   Mr. Bryan Woods was instructed to call 911 with any severe reactions post vaccine: Marland Kitchen Difficulty breathing  . Swelling of face and throat  . A fast heartbeat  . A bad rash all over body  . Dizziness and weakness   Immunizations Administered    Name Date Dose VIS Date Route   Pfizer COVID-19 Vaccine 11/19/2020  4:33 PM 0.3 mL 09/05/2020 Intramuscular   Manufacturer: [employer]   Lot: [id number]   NDC: [address]-2
